# Patient Record
Sex: Female | Born: 1945 | Race: White | Hispanic: No | Smoking: Never smoker
Health system: Southern US, Community
[De-identification: ages and names within clinical notes are randomized; demographics above are authoritative.]

## PROBLEM LIST (undated history)

## (undated) DIAGNOSIS — G35 Multiple sclerosis: Secondary | ICD-10-CM

## (undated) DIAGNOSIS — B029 Zoster without complications: Secondary | ICD-10-CM

## (undated) HISTORY — PX: ROTATOR CUFF REPAIR: SHX139

## (undated) HISTORY — PX: OTHER SURGICAL HISTORY: SHX169

## (undated) HISTORY — DX: Zoster without complications: B02.9

## (undated) HISTORY — PX: KNEE SURGERY: SHX244

## (undated) HISTORY — PX: BACK SURGERY: SHX140

## (undated) HISTORY — DX: Multiple sclerosis: G35

---

## 2004-06-05 HISTORY — PX: REPLACEMENT TOTAL KNEE: SUR1224

## 2012-06-05 HISTORY — PX: OTHER SURGICAL HISTORY: SHX169

## 2015-07-07 DIAGNOSIS — G35 Multiple sclerosis: Secondary | ICD-10-CM | POA: Insufficient documentation

## 2017-08-03 DIAGNOSIS — F329 Major depressive disorder, single episode, unspecified: Secondary | ICD-10-CM | POA: Insufficient documentation

## 2017-08-03 DIAGNOSIS — I10 Essential (primary) hypertension: Secondary | ICD-10-CM | POA: Insufficient documentation

## 2017-08-03 DIAGNOSIS — N393 Stress incontinence (female) (male): Secondary | ICD-10-CM | POA: Insufficient documentation

## 2017-08-03 DIAGNOSIS — E559 Vitamin D deficiency, unspecified: Secondary | ICD-10-CM | POA: Insufficient documentation

## 2018-04-09 DIAGNOSIS — H538 Other visual disturbances: Secondary | ICD-10-CM | POA: Insufficient documentation

## 2018-07-17 DIAGNOSIS — M542 Cervicalgia: Secondary | ICD-10-CM | POA: Insufficient documentation

## 2019-01-08 ENCOUNTER — Ambulatory Visit: Payer: Medicare Other | Admitting: Neurology

## 2019-01-15 ENCOUNTER — Encounter: Payer: Self-pay | Admitting: Neurology

## 2019-01-15 ENCOUNTER — Telehealth: Payer: Self-pay | Admitting: *Deleted

## 2019-01-15 ENCOUNTER — Ambulatory Visit (INDEPENDENT_AMBULATORY_CARE_PROVIDER_SITE_OTHER): Payer: Medicare Other | Admitting: Neurology

## 2019-01-15 ENCOUNTER — Other Ambulatory Visit: Payer: Self-pay

## 2019-01-15 VITALS — BP 120/80 | HR 61 | Temp 95.7°F | Ht 66.0 in | Wt 196.4 lb

## 2019-01-15 DIAGNOSIS — F329 Major depressive disorder, single episode, unspecified: Secondary | ICD-10-CM

## 2019-01-15 DIAGNOSIS — E559 Vitamin D deficiency, unspecified: Secondary | ICD-10-CM

## 2019-01-15 DIAGNOSIS — G35 Multiple sclerosis: Secondary | ICD-10-CM | POA: Diagnosis not present

## 2019-01-15 DIAGNOSIS — Z79899 Other long term (current) drug therapy: Secondary | ICD-10-CM | POA: Diagnosis not present

## 2019-01-15 DIAGNOSIS — R269 Unspecified abnormalities of gait and mobility: Secondary | ICD-10-CM

## 2019-01-15 DIAGNOSIS — M1710 Unilateral primary osteoarthritis, unspecified knee: Secondary | ICD-10-CM | POA: Insufficient documentation

## 2019-01-15 DIAGNOSIS — M179 Osteoarthritis of knee, unspecified: Secondary | ICD-10-CM | POA: Insufficient documentation

## 2019-01-15 DIAGNOSIS — E785 Hyperlipidemia, unspecified: Secondary | ICD-10-CM

## 2019-01-15 DIAGNOSIS — N393 Stress incontinence (female) (male): Secondary | ICD-10-CM

## 2019-01-15 MED ORDER — MODAFINIL 100 MG PO TABS: ORAL_TABLET | ORAL | 1 refills | Status: DC

## 2019-01-15 MED ORDER — LAMOTRIGINE 100 MG PO TABS: 100.0000 mg | ORAL_TABLET | Freq: Two times a day (BID) | ORAL | 3 refills | Status: DC

## 2019-01-15 MED ORDER — ATORVASTATIN CALCIUM 20 MG PO TABS: 20.0000 mg | ORAL_TABLET | Freq: Every day | ORAL | 3 refills | Status: DC

## 2019-01-15 NOTE — Telephone Encounter (Signed)
Faxed completed/signed to [facility] at [phone]. They were requesting updated form. Received fax confirmation. Pt already on Aubagio.

## 2019-01-15 NOTE — Patient Instructions (Signed)
The pharmacy has the prescription for lamotrigine 100 mg tablets. For 5 days, just take one half pill a day. For the next 5 days, take one half pill twice a day. For the next 5 days, take one half pill 3 times a day Then start taking one pill twice a day from this point on.    In the future, we may increase the dose further.  If you get a rash, need to stop the medication and not take it again. 

## 2019-01-15 NOTE — Telephone Encounter (Signed)
I r/c report from [facility], pt cd will be mail to [place].

## 2019-01-15 NOTE — Progress Notes (Signed)
[facility]  PATIENT: Kathy Nelson DOB: 06/11/1945  REFERRING DOCTOR OR PCP: PCP is [facility] [place], referred by Dr. Coral Else FNP SOURCE: Patient, notes from [facility] neurology, imaging reports, MRI images personally reviewed.  _________________________________   HISTORICAL  CHIEF COMPLAINT:  Chief Complaint  Patient presents with  . New Patient (Initial Visit)    RM 13, alone. Paper referral from Dr. Geanie Cooley for MS. Used to see Dr. Anselm Pancoast at [facility]. Just moved to [place] area 10/30/18. Previous neurologist recommended Dr. Epimenio Foot.   . Multiple Sclerosis    She is on Aubagio, montly IVSM. Also on gabapentin.     HISTORY OF PRESENT ILLNESS:  I had the pleasure of seeing your patient, Kathy Nelson, at the MS center at [facility] for neurologic consultation regarding her multiple sclerosis.  She was diagnosed with MS in 49 (Dr. Dortha Schwalbe in [place]) after presenting with flank dysesthesias. In retrospect she had optic neuritis in 1987.    She was first told she might have cholecystitis.   Then Imaging studies were consistent with MS.   CSF was also consistent with MS.   She was placed on Avonex and remained on it x many years.    She did not have any exacerbations.  She moved to [facility] and started to see Dr. Freda Munro.  When he retired, she started to see Dr. Vernie Ammons.     More recently, she was placed on Aubagio (March 2020).    [facility] needs a new start form.   She has been doing monthly IV Solu-Medrol for the past year.     Gait has slowly worsened   Currently, she is off balanced and uses a cane as needed but walks without one much of the time.   She denies much weakness but notes more issues with her left leg due to left knee pain (she already had right TKR).   She has right flank tight dysesthetic sensations.  She also has  A swollen sensation in her left hand.     She takes gabapentin and  is unsure how much it helps.   She has some urinary leakage but no frank incontinence.   She has had some visual blurring but notes h/o cataracts and laser operations.    Colors are desaturated OD since she had ON in 1987 Marland KitchenGorden Nelson sees her).    She has fatigue and does better with modafinil.   She has some insomnia and can't turn her brain off some nights.    She has depression,a little worse during the pandemic as her assisted living is 'locked down' and there are no social activities.   Cognition is doing well.   Other medical issues:   She is noting more aches and pains in her neck, lower back and left knee.  She had lumbar surgery L4L5 in the past.   She has hypertension and atorvastatin.     REVIEW OF SYSTEMS: Constitutional: No fevers, chills, sweats, or change in appetite.  She has fatigue.   Eyes: No visual changes, double vision, eye pain Ear, nose and throat: No hearing loss, ear pain, nasal congestion, sore throat Cardiovascular: No chest pain, palpitations Respiratory: No shortness of breath at rest or with exertion.   No wheezes GastrointestinaI: No nausea, vomiting, diarrhea, abdominal pain, fecal incontinence Genitourinary: No dysuria, urinary retention or frequency.  No nocturia. Musculoskeletal:She has neck pain, low back pain and left knee pain.  She  has a history of lumbar surgery at L4-L5.   Integumentary: No rash, pruritus, skin lesions Neurological: as above Psychiatric: She has had depression. Endocrine: No palpitations, diaphoresis, change in appetite, change in weigh or increased thirst Hematologic/Lymphatic: No anemia, purpura, petechiae. Allergic/Immunologic: No itchy/runny eyes, nasal congestion, recent allergic reactions, rashes  ALLERGIES: Allergies  Allergen Reactions  . Penicillins Rash    Was told by her mother she was allergic to Penicillin Was told by her mother she was allergic to Penicillin     HOME MEDICATIONS:  Current Outpatient  Medications:  .  atorvastatin (LIPITOR) 20 MG tablet, Take 1 tablet (20 mg total) by mouth daily., Disp: 90 tablet, Rfl: 3 .  BIOTIN PO, Take 1 tablet by mouth daily., Disp: , Rfl:  .  buPROPion (WELLBUTRIN XL) 300 MG 24 hr tablet, Take 300 mg by mouth daily., Disp: , Rfl:  .  Cholecalciferol (VITAMIN D3 PO), Take 5,000 Units by mouth daily., Disp: , Rfl:  .  FLUoxetine (PROZAC) 20 MG tablet, Take 20 mg by mouth daily., Disp: , Rfl:  .  gabapentin (NEURONTIN) 600 MG tablet, Take 600 mg by mouth 3 (three) times daily., Disp: , Rfl:  .  ibuprofen (ADVIL) 200 MG tablet, Take 200 mg by mouth every 6 (six) hours as needed., Disp: , Rfl:  .  modafinil (PROVIGIL) 100 MG tablet, 1.5 tablets by mouth twice daily, Disp: 270 tablet, Rfl: 1 .  Omega-3 Fatty Acids (FISH OIL) 1000 MG CAPS, Take 1 capsule by mouth daily., Disp: , Rfl:  .  Teriflunomide (AUBAGIO) 14 MG TABS, Take 1 tablet by mouth daily., Disp: , Rfl:  .  valsartan (DIOVAN) 40 MG tablet, Take 40 mg by mouth daily., Disp: , Rfl:  .  lamoTRIgine (LAMICTAL) 100 MG tablet, Take 1 tablet (100 mg total) by mouth 2 (two) times daily., Disp: 180 tablet, Rfl: 3  PAST MEDICAL HISTORY: Past Medical History:  Diagnosis Date  . MS (multiple sclerosis) (HCC)   . Shingles     PAST SURGICAL HISTORY: Past Surgical History:  Procedure Laterality Date  . BACK SURGERY    . BACK SURGERY    . cataract surgery    . dental implants  2014  . KNEE SURGERY    . REPLACEMENT TOTAL KNEE  2006  . ROTATOR CUFF REPAIR      FAMILY HISTORY: History reviewed. No pertinent family history.  SOCIAL HISTORY:  Social History   Socioeconomic History  . Marital status: Widowed    Spouse name: Not on file  . Number of children: Not on file  . Years of education: Not on file  . Highest education level: Not on file  Occupational History  . Not on file  Social Needs  . Financial resource strain: Not on file  . Food insecurity    Worry: Not on file     Inability: Not on file  . Transportation needs    Medical: Not on file    Non-medical: Not on file  Tobacco Use  . Smoking status: Never Smoker  . Smokeless tobacco: Never Used  Substance and Sexual Activity  . Alcohol use: Yes    Frequency: Never    Comment: Daily with dinner - wine  . Drug use: Never  . Sexual activity: Not on file  Lifestyle  . Physical activity    Days per week: Not on file    Minutes per session: Not on file  . Stress: Not on file  Relationships  .  Social Musicianconnections    Talks on phone: Not on file    Gets together: Not on file    Attends religious service: Not on file    Active member of club or organization: Not on file    Attends meetings of clubs or organizations: Not on file    Relationship status: Not on file  . Intimate partner violence    Fear of current or ex partner: Not on file    Emotionally abused: Not on file    Physically abused: Not on file    Forced sexual activity: Not on file  Other Topics Concern  . Not on file  Social History Narrative   Lives alone   No caffeine use   Right handed      PHYSICAL EXAM  Vitals:   01/15/19 0902  BP: 120/80  Pulse: 61  Temp: (!) 95.7 F (35.4 C)  SpO2: 96%  Weight: 196 lb 6.4 oz (89.1 kg)  Height: 5\' 6"  (1.676 m)    Body mass index is 31.7 kg/m.   General: The patient is well-developed and well-nourished and in no acute distress  HEENT:  Head is [place]/AT.  Sclera are anicteric.  Funduscopic exam shows normal optic discs and retinal vessels.  Neck: No carotid bruits are noted.  The neck is nontender.  Cardiovascular: The heart has a regular rate and rhythm with a normal S1 and S2. There were no murmurs, gallops or rubs.    Skin: Extremities are without rash or  edema.  Musculoskeletal:  Back is nontender  Neurologic Exam  Mental status: The patient is alert and oriented x 3 at the time of the examination. The patient has apparent normal recent and remote memory, with an  apparently normal attention span and concentration ability.   Speech is normal.  Cranial nerves: Extraocular movements are full. Pupils are equal, round, and reactive to light and accomodation.  Colors desaturated OD.  Facial symmetry is present. There is good facial sensation to soft touch bilaterally.Facial strength is normal.  Trapezius and sternocleidomastoid strength is normal. No dysarthria is noted.  The tongue is midline, and the patient has symmetric elevation of the soft palate. No obvious hearing deficits are noted.  Motor:  Muscle bulk is normal.   Tone is normal. Strength is  5 / 5 in all 4 extremities.   Sensory: Sensory testing is intact to pinprick, soft touch and vibration sensation in arms and trunk but reduced in right leg.     Coordination: Cerebellar testing reveals good finger-nose-finger and heel-to-shin bilaterally.  Gait and station: Station is normal.   Gait is slightly wide. She can't tandem walk. Romberg is negative.   Reflexes: Deep tendon reflexes are symmetric and normal bilaterally.   Plantar responses are flexor.      ASSESSMENT AND PLAN     1. Multiple sclerosis (HCC)   2. Gait disturbance   3. High risk medication use   4. Major depressive disorder, remission status unspecified, unspecified whether recurrent   5. Vitamin D deficiency   6. Stress incontinence, female   7. Hyperlipidemia, unspecified hyperlipidemia type     In summary, Ms. Dorian FurnaceBullen is a 73 year old woman with a long history of multiple sclerosis who is transferring her care due to her recent move.  Her current impairments are stable and she will continue on Aubagio.  We will check blood work today.  Due to her persistent dysesthesias, I will start lamotrigine and titrate up to 100 mg p.o. twice  daily.  I will renew her Provigil and statin.  She would like to defer repeating the MRI at this time.  She has been stable we will consider checking around the time of the next visit to determine  if there is any subclinical activity that would prompt a switch in medication.  She is advised to stay active and exercise as tolerated and to continue vitamin D supplementation.  She will return to see me in 6 months for regular visit but call sooner if new or worsening neurologic symptoms.  Thank you for asking me to see Ms. Ess.  Please let me know if I can be of further assistance with her or other patients in the future.    Alie Hardgrove A. Felecia Shelling, MD, PhD, FAAN Certified in Neurology, Clinical Neurophysiology, Sleep Medicine, Pain Medicine and Neuroimaging Director, [place] at [facility] [address] [place], [place] [address] [phone]

## 2019-01-15 NOTE — Telephone Encounter (Signed)
Gave report to Dr. Felecia Shelling to review

## 2019-01-15 NOTE — Telephone Encounter (Signed)
Request made today to [facility] [phone]

## 2019-01-16 ENCOUNTER — Telehealth: Payer: Self-pay | Admitting: *Deleted

## 2019-01-16 LAB — CBC WITH DIFFERENTIAL/PLATELET
Basophils Absolute: 0.1 10*3/uL (ref 0.0–0.2)
Basos: 1 %
EOS (ABSOLUTE): 0.3 10*3/uL (ref 0.0–0.4)
Eos: 4 %
Hematocrit: 40.3 % (ref 34.0–46.6)
Hemoglobin: 13.2 g/dL (ref 11.1–15.9)
Immature Grans (Abs): 0 10*3/uL (ref 0.0–0.1)
Immature Granulocytes: 0 %
Lymphocytes Absolute: 2 10*3/uL (ref 0.7–3.1)
Lymphs: 26 %
MCH: 28.6 pg (ref 26.6–33.0)
MCHC: 32.8 g/dL (ref 31.5–35.7)
MCV: 87 fL (ref 79–97)
Monocytes Absolute: 0.8 10*3/uL (ref 0.1–0.9)
Monocytes: 11 %
Neutrophils Absolute: 4.4 10*3/uL (ref 1.4–7.0)
Neutrophils: 58 %
Platelets: 227 10*3/uL (ref 150–450)
RBC: 4.61 x10E6/uL (ref 3.77–5.28)
RDW: 13.1 % (ref 11.7–15.4)
WBC: 7.8 10*3/uL (ref 3.4–10.8)

## 2019-01-16 LAB — COMPREHENSIVE METABOLIC PANEL
ALT: 19 IU/L (ref 0–32)
AST: 19 IU/L (ref 0–40)
Albumin/Globulin Ratio: 2.2 (ref 1.2–2.2)
Albumin: 4.3 g/dL (ref 3.7–4.7)
Alkaline Phosphatase: 98 IU/L (ref 39–117)
BUN/Creatinine Ratio: 20 (ref 12–28)
BUN: 14 mg/dL (ref 8–27)
Bilirubin Total: 0.4 mg/dL (ref 0.0–1.2)
CO2: 28 mmol/L (ref 20–29)
Calcium: 9.6 mg/dL (ref 8.7–10.3)
Chloride: 102 mmol/L (ref 96–106)
Creatinine, Ser: 0.69 mg/dL (ref 0.57–1.00)
GFR calc Af Amer: 98 mL/min/{1.73_m2} (ref >59)
GFR calc non Af Amer: 85 mL/min/{1.73_m2} (ref >59)
Globulin, Total: 2 g/dL (ref 1.5–4.5)
Glucose: 80 mg/dL (ref 65–99)
Potassium: 4.4 mmol/L (ref 3.5–5.2)
Sodium: 141 mmol/L (ref 134–144)
Total Protein: 6.3 g/dL (ref 6.0–8.5)

## 2019-01-16 NOTE — Telephone Encounter (Signed)
Called, LVM for pt about lab results per Dr. Felecia Shelling note. Gave [facility] phone number if she has further questions but instructed she did not have to call back otherwise.

## 2019-01-16 NOTE — Telephone Encounter (Signed)
-----   Message from Richard A Sater, MD sent at 01/16/2019  9:16 AM EDT ----- Please let the patient know that the lab work is fine.  

## 2019-01-23 NOTE — Telephone Encounter (Signed)
R/c cd from [facility] cd on [place] desk

## 2019-01-23 NOTE — Telephone Encounter (Signed)
Gave CD to Dr. Felecia Shelling to review

## 2019-01-27 ENCOUNTER — Telehealth: Payer: Self-pay | Admitting: Neurology

## 2019-01-27 NOTE — Telephone Encounter (Signed)
I personally reviewed MRIs of the brain, cervical and thoracic spine dated 11/23/2017.   The MRI of the brain shows typical T2/flair hyperintense foci in the periventricular, juxtacortical deep white matter of the hemispheres consistent with MS.  There is cortical atrophy.   MRI of the cervical spine shows no definite MS plaques.  Cervical degenerative changes noted at C3-C4, C4-C5, C5-C6 and C6-C7.  There is bilateral foraminal narrowing at C5-C6 and left greater than right foraminal narrowing at C6-C7  MRI of the thoracic spine shows several T2 hyperintense foci.   Unfortunately, the CD PACS does not have the ability to assess spinal levels with scout lines.  In this context, there appears to be a focus at T3-T4, T5-T6 and T8-T9 towards the left.

## 2019-02-03 ENCOUNTER — Telehealth: Payer: Self-pay | Admitting: Neurology

## 2019-02-03 NOTE — Telephone Encounter (Signed)
Pt is asking for a call to discuss the instructions on taking the lamoTRIgine (LAMICTAL) 100 MG tablet

## 2019-02-03 NOTE — Telephone Encounter (Signed)
Called pt back. She wanted to know if she should be stopping the gabapentin and starting lamotrigine. She has directions from Dr. Felecia Shelling for lamotrigine and aware pill can be cut in half: For 5 days, just take one half pill a day. For the next 5 days, take one half pill twice a day. For the next 5 days, take one half pill 3 times a day Then start taking one pill twice a day from this point on.   In the future, we may increase the dose further.  Advised I will ask Dr. Felecia Shelling and call back to let her know. She states its ok to LVM

## 2019-02-03 NOTE — Telephone Encounter (Signed)
She can taper the gabapentin off over a week or so as she is starting on the lamotrigine.

## 2019-02-03 NOTE — Telephone Encounter (Signed)
Dr. Felecia Shelling- how would you like her to taper off the gabapentin?

## 2019-02-03 NOTE — Telephone Encounter (Signed)
Go down to two a day for a week then go down to 1 a day for a week then stop

## 2019-02-03 NOTE — Telephone Encounter (Signed)
Dr. Felecia Shelling- should she stop gabapentin and start lamotrigine or was lamotrigine an addition to her medications?

## 2019-02-04 NOTE — Telephone Encounter (Signed)
Called pt. She has about 24 capsule of gabapentin left. She repeated back taper instructions correctly: 2/day for 1 week and then go down to 1 per day for 1 week and stop. She will call back if she has further questions. She is aware she can taper off gabapentin as she is starting on the lamotrigine per Dr. Felecia Shelling.

## 2019-04-09 ENCOUNTER — Other Ambulatory Visit: Payer: Self-pay | Admitting: *Deleted

## 2019-04-09 ENCOUNTER — Telehealth: Payer: Self-pay | Admitting: *Deleted

## 2019-04-09 DIAGNOSIS — G35 Multiple sclerosis: Secondary | ICD-10-CM

## 2019-04-09 MED ORDER — AUBAGIO 14 MG PO TABS: 1.0000 | ORAL_TABLET | Freq: Every day | ORAL | 12 refills | Status: DC

## 2019-04-09 NOTE — Telephone Encounter (Signed)
Received fax from [place] one to one to send new rx for Aubagio #30, 12 refills for PAP. Faxed printed/signed rx to them at [phone]. Received fax confirmation.

## 2019-05-08 ENCOUNTER — Telehealth: Payer: Self-pay | Admitting: Neurology

## 2019-05-08 NOTE — Telephone Encounter (Signed)
Pt has called for refills on FLUoxetine (PROZAC) 20 MG tablet,  buPROPion (WELLBUTRIN XL) 300 MG 24 hr tablet,atorvastatin (LIPITOR) 20 MG tablet, & amdlopipine 5mg  Pt is asking that these be filled thru  [facility] ph# [phone]

## 2019-05-08 NOTE — Telephone Encounter (Signed)
Called pt back. Relayed that I reviewed her chart and appears Dr. Felecia Shelling refilled atorvastatin 01/15/2019 #90, 3 refills to [facility]. The other three meds: fluoxetine, bupropion, amlodipine he did not prescribe. Recommended she contact PCP about refilling these. She verbalized understanding.

## 2019-05-13 ENCOUNTER — Telehealth: Payer: Self-pay | Admitting: *Deleted

## 2019-05-13 DIAGNOSIS — G35 Multiple sclerosis: Secondary | ICD-10-CM

## 2019-05-13 NOTE — Telephone Encounter (Signed)
Called and spoke with pt. Advised we received fax from [place] one to one that [place] will expire soon. They list insurance as [employer] but we have [place] on file. She states she has both. [employer] is for prescription coverage. ID: [id number]. Rxgroup: [id number]. RxPCN: [id number]. RxBIN: [id number]. Phone: [phone].   She was told there is no funding left after the first of the year by [facility] but told to call back early January to speak with them and work out next steps.  FYI-She is going to see Dr. Ouida Sills (PCP) to establish care at [facility]. She is living at [facility] right now.

## 2019-05-14 NOTE — Telephone Encounter (Signed)
Unable to submit PA request through covermymeds prior to expiration date of current one on file.  I called [employer]'s PA line [phone]) to initiate the case over the phone.  The rep was able to pull her previous formulary exception information forward to the new case (BC#[id number]).  The decision should be received in 24-72 hours.  If no information by then, call [phone].

## 2019-05-19 NOTE — Telephone Encounter (Signed)
Received fax notification from [facility] that [place] approved until 06/04/2020. Faxed approval info to [place] one to one at [phone]. Received fax confirmation.

## 2019-05-23 NOTE — Telephone Encounter (Signed)
Pt has called to inform that she has dropped out of the Aubagio program due to annual cost being $24,000 pt said even with insurance she would be looking at $2,000 a month which is not an option for her.  Pt is refusing to spend a lot of time on the phone with the [employer].  Pt states Dr Felecia Shelling will need to work out something for her if he wants her to stay on this medication.  If not pt is asking to be put on something else

## 2019-05-26 NOTE — Telephone Encounter (Signed)
Called, LVM for pt to call office. 

## 2019-05-26 NOTE — Telephone Encounter (Signed)
Took call from phone staff and spoke with pt. She was not accepted into financial assistance program again for Aubagio. They only could provide her for the 1 yr she had it. She called other foundations but no one else had funding she called. She then received letter from [facility] that she will have 2000 copay each month for Aubagio even though it is covered. Usually 24,000 dollars per year. They gave her another list of foundations to call to receive funding. She declined. She does not want to spend more time to look for funding. She would like to talk about other options. She is aware Dr. Felecia Shelling back in the office next week. She has about 2 months of Aubagio left. She is ok to wait until Dr. Felecia Shelling comes back to determine next steps. Advised I will call her next week.

## 2019-05-28 MED ORDER — LEFLUNOMIDE 20 MG PO TABS: ORAL_TABLET | ORAL | 11 refills | Status: DC

## 2019-05-28 NOTE — Telephone Encounter (Signed)
If she would like, we can start her on leflunomide 20 mg, 1 a day. You can let her know that leflunomide and Aubagio are very similar. Leflunomide has been used for rheumatoid arthritis for many years. The main difference is that Aubagio is usually tolerated a little bit better (GI) but that that difference matter is most during the first month or 2.  Therefore, she should be able to take Aubagio 1 day and stop leflunomide the next. If she would prefer to have an appointment with me we can try to do something in January.

## 2019-05-28 NOTE — Addendum Note (Signed)
Addended by: Hope Pigeon on: 05/28/2019 12:43 PM   Modules accepted: Orders

## 2019-05-28 NOTE — Telephone Encounter (Signed)
Called and spoke with pt. She is going to finish what she has of Aubagio and then switch to leflunomide. She would like rx sent to [place], [place].

## 2019-06-07 ENCOUNTER — Telehealth: Payer: Self-pay | Admitting: Neurology

## 2019-06-07 NOTE — Telephone Encounter (Signed)
I reviewed her neuro-opth evaluation 04/02/19.  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  No acute findings

## 2019-06-24 ENCOUNTER — Other Ambulatory Visit: Payer: Self-pay

## 2019-06-24 ENCOUNTER — Emergency Department: Payer: Medicare Other

## 2019-06-24 ENCOUNTER — Emergency Department
Admission: EM | Admit: 2019-06-24 | Discharge: 2019-06-24 | Disposition: A | Discharge status: Home / Self Care | Payer: Medicare Other | Attending: Emergency Medicine | Admitting: Emergency Medicine

## 2019-06-24 ENCOUNTER — Encounter: Payer: Self-pay | Admitting: Emergency Medicine

## 2019-06-24 DIAGNOSIS — Y999 Unspecified external cause status: Secondary | ICD-10-CM | POA: Insufficient documentation

## 2019-06-24 DIAGNOSIS — Y92003 Bedroom of unspecified non-institutional (private) residence as the place of occurrence of the external cause: Secondary | ICD-10-CM | POA: Insufficient documentation

## 2019-06-24 DIAGNOSIS — W01190A Fall on same level from slipping, tripping and stumbling with subsequent striking against furniture, initial encounter: Secondary | ICD-10-CM | POA: Diagnosis not present

## 2019-06-24 DIAGNOSIS — Z79899 Other long term (current) drug therapy: Secondary | ICD-10-CM | POA: Insufficient documentation

## 2019-06-24 DIAGNOSIS — G35 Multiple sclerosis: Secondary | ICD-10-CM | POA: Diagnosis not present

## 2019-06-24 DIAGNOSIS — I10 Essential (primary) hypertension: Secondary | ICD-10-CM | POA: Insufficient documentation

## 2019-06-24 DIAGNOSIS — S79911A Unspecified injury of right hip, initial encounter: Secondary | ICD-10-CM | POA: Diagnosis present

## 2019-06-24 DIAGNOSIS — S7001XA Contusion of right hip, initial encounter: Secondary | ICD-10-CM | POA: Diagnosis not present

## 2019-06-24 DIAGNOSIS — Y9389 Activity, other specified: Secondary | ICD-10-CM | POA: Insufficient documentation

## 2019-06-24 NOTE — Discharge Instructions (Signed)
Please continue medications as you have listed.  Follow up with primary care or orthopedics if not improving over the week.  Return to the ER for symptoms that change or worsen or for new concerns if unable to schedule an appointment.

## 2019-06-24 NOTE — ED Notes (Signed)
See triage note  Presents s/p fall  States she fell   Hitting her right mid back and right buttocks

## 2019-06-24 NOTE — ED Triage Notes (Signed)
Patient reports she tripped in her bedroom and fell backward, hitting the middle of her back on the bed post. States she is now having pain in her back with twisting and bending. Ambulatory with cane in triage.

## 2019-06-24 NOTE — ED Provider Notes (Signed)
[facility] Emergency Department Provider Note ____________________________________________  Time seen: Approximately 5:19 PM  I have reviewed the triage vital signs and the nursing notes.   HISTORY  Chief Complaint Back Pain    HPI Kathy Nelson is a 74 y.o. female who presents to the emergency department for evaluation and treatment of right hip pain aftertripping in her bedroom. Her right hip struck the squared edge of the footboard of her bed. She states that the pain was intense and she laid on the floor for a bit until she felt she could bear weight. She was then able to get up without assistance. She took Excedrin and applied Arnica to the area and then decided that she may need to have an x-ray due to the degree of pain. No previous hip injuries/surgery.  Past Medical History:  Diagnosis Date  . MS (multiple sclerosis) ([place])   . Shingles     Patient Active Problem List   Diagnosis Date Noted  . Osteoarthritis of knee 01/15/2019  . Gait disturbance 01/15/2019  . High risk medication use 01/15/2019  . Hyperlipidemia 01/15/2019  . Neck pain 07/17/2018  . Blurry vision, bilateral 04/09/2018  . Essential hypertension 08/03/2017  . Major depressive disorder 08/03/2017  . Stress incontinence, female 08/03/2017  . Vitamin D deficiency 08/03/2017  . Multiple sclerosis ([place]) 07/07/2015    Past Surgical History:  Procedure Laterality Date  . BACK SURGERY    . BACK SURGERY    . cataract surgery    . dental implants  2014  . KNEE SURGERY    . REPLACEMENT TOTAL KNEE  2006  . ROTATOR CUFF REPAIR      Prior to Admission medications   Medication Sig Start Date End Date Taking? Authorizing Provider  atorvastatin (LIPITOR) 20 MG tablet Take 1 tablet (20 mg total) by mouth daily. 01/15/19   Sater, Nanine Means, MD  BIOTIN PO Take 1 tablet by mouth daily.    [provider]  buPROPion (WELLBUTRIN XL) 300 MG 24 hr tablet Take 300 mg by mouth  daily.    [provider]  Cholecalciferol (VITAMIN D3 PO) Take 5,000 Units by mouth daily.    [provider]  FLUoxetine (PROZAC) 20 MG tablet Take 20 mg by mouth daily.    [provider]  gabapentin (NEURONTIN) 600 MG tablet Take 600 mg by mouth 3 (three) times daily.    [provider]  ibuprofen (ADVIL) 200 MG tablet Take 200 mg by mouth every 6 (six) hours as needed.    [provider]  lamoTRIgine (LAMICTAL) 100 MG tablet Take 1 tablet (100 mg total) by mouth 2 (two) times daily. 01/15/19   Sater, Nanine Means, MD  modafinil (PROVIGIL) 100 MG tablet 1.5 tablets by mouth twice daily 01/15/19   Sater, Nanine Means, MD  Omega-3 Fatty Acids (FISH OIL) 1000 MG CAPS Take 1 capsule by mouth daily.    [provider]  Teriflunomide (AUBAGIO) 14 MG TABS Take 1 tablet by mouth daily. 04/09/19   Sater, Nanine Means, MD  valsartan (DIOVAN) 40 MG tablet Take 40 mg by mouth daily.    [provider]    Allergies Penicillins  No family history on file.  Social History Social History   Tobacco Use  . Smoking status: Never Smoker  . Smokeless tobacco: Never Used  Substance Use Topics  . Alcohol use: Yes    Comment: Daily with dinner - wine  . Drug use: Never  Review of Systems Constitutional: Negative for fever. Cardiovascular: Negative for chest pain. Respiratory: Negative for shortness of breath. Musculoskeletal: Positive for right hip pain. Skin: Negative for open wounds or lesions.  Neurological: Negative for decrease in sensation  ____________________________________________   PHYSICAL EXAM:  VITAL SIGNS: ED Triage Vitals [06/24/19 1657]  Enc Vitals Group     BP (!) 198/76     Pulse Rate 73     Resp 16     Temp 98.2 F (36.8 C)     Temp Source Oral     SpO2 97 %     Weight 180 lb (81.6 kg)     Height 5\' 6"  (1.676 m)     Head Circumference      Peak Flow      Pain Score 9     Pain Loc      Pain Edu?      Excl.  in GC?     Constitutional: Alert and oriented. Well appearing and in no acute distress. Eyes: Conjunctivae are clear without discharge or drainage Head: Atraumatic Neck: Supple.  Respiratory: No cough. Respirations are even and unlabored. Musculoskeletal: Focal area of tenderness in the lower,posterior, midline right buttock that increases with movement. Neurologic: Motor and sensory intact.  Skin: Intact.  Psychiatric: Affect and behavior are appropriate.  ____________________________________________   LABS (all labs ordered are listed, but only abnormal results are displayed)  Labs Reviewed - No data to display ____________________________________________  RADIOLOGY  Image of the right hip and pelvis are negative for acute findings per radiology. ____________________________________________   PROCEDURES  Procedures  ____________________________________________   INITIAL IMPRESSION / ASSESSMENT AND PLAN / ED COURSE  Kathy Nelson is a 74 y.o. who presents to the emergency department for evaluation of right hip pain after accidental injury at home this afternoon. See HPI for further details. Plan will be to get an x-ray.  Differential diagnosis includes, but not limited to: Pelvic fracture, bone contusion, hematoma.  X-ray is reassuring and shows no fractures. She will be discharged home. Pain medication offered, however she prefers to take her Excedrin or ibuprofen and use ice.  Patient instructed to follow-up with orthopedics if not improving over the week.  She was also instructed to return to the emergency department for symptoms that change or worsen if unable schedule an appointment with orthopedics or primary care.  Medications - No data to display  Pertinent labs & imaging results that were available during my care of the patient were reviewed by me and considered in my medical decision making (see chart for  details).  _________________________________________   FINAL CLINICAL IMPRESSION(S) / ED DIAGNOSES  Final diagnoses:  Contusion of right hip, initial encounter    ED Discharge Orders    None       If controlled substance prescribed during this visit, 12 month history viewed on the [facility] prior to issuing an initial prescription for Schedule II or III opiod.   59, FNP 06/24/19 1931    06/26/19, MD 06/25/19 2348

## 2019-07-22 ENCOUNTER — Ambulatory Visit: Payer: Medicare Other | Admitting: Neurology

## 2019-07-23 ENCOUNTER — Telehealth: Payer: Self-pay | Admitting: Neurology

## 2019-07-23 NOTE — Telephone Encounter (Signed)
I called patient and LVM regarding rescheduling 2/18 appointment due to inclement weather/office closure. Requested patient call back to reschedule.

## 2019-07-24 ENCOUNTER — Ambulatory Visit: Payer: Medicare Other | Admitting: Neurology

## 2019-08-07 ENCOUNTER — Other Ambulatory Visit: Payer: Self-pay | Admitting: *Deleted

## 2019-08-07 MED ORDER — MODAFINIL 100 MG PO TABS: ORAL_TABLET | ORAL | 1 refills | Status: DC

## 2019-08-12 ENCOUNTER — Telehealth: Payer: Self-pay | Admitting: Neurology

## 2019-08-12 NOTE — Telephone Encounter (Signed)
PA Case: [id number], Status: Approved, Coverage Starts on: 08/12/2019 12:00:00 AM, Coverage Ends on: 06/04/2020 12:00:00 AM. Questions? Contact [phone].

## 2019-08-12 NOTE — Telephone Encounter (Signed)
1) Medication(s) Requested (by name): modafinil (PROVIGIL) 100 MG tablet   2) Pharmacy of Choice: [facility] - [place], [place] - [address]  [address] [place] [address]   3) Special Requests:   Pharmacy is requesting a PA

## 2019-08-12 NOTE — Telephone Encounter (Signed)
Submitted PA on CMM. Key: [id number] - PA Case ID: [id number] - Rx #: [id number]. Waiting on determination from [facility].

## 2019-09-18 ENCOUNTER — Other Ambulatory Visit: Payer: Self-pay

## 2019-09-18 ENCOUNTER — Ambulatory Visit (INDEPENDENT_AMBULATORY_CARE_PROVIDER_SITE_OTHER): Payer: Medicare Other | Admitting: Neurology

## 2019-09-18 ENCOUNTER — Encounter: Payer: Self-pay | Admitting: Neurology

## 2019-09-18 VITALS — BP 163/78 | HR 65 | Temp 96.9°F | Ht 66.0 in | Wt 180.0 lb

## 2019-09-18 DIAGNOSIS — Z79899 Other long term (current) drug therapy: Secondary | ICD-10-CM

## 2019-09-18 DIAGNOSIS — R269 Unspecified abnormalities of gait and mobility: Secondary | ICD-10-CM | POA: Diagnosis not present

## 2019-09-18 DIAGNOSIS — R5383 Other fatigue: Secondary | ICD-10-CM

## 2019-09-18 DIAGNOSIS — G35 Multiple sclerosis: Secondary | ICD-10-CM | POA: Diagnosis not present

## 2019-09-18 MED ORDER — AMANTADINE HCL 100 MG PO CAPS: 100.0000 mg | ORAL_CAPSULE | Freq: Two times a day (BID) | ORAL | 11 refills | Status: DC

## 2019-09-18 MED ORDER — ALPRAZOLAM 0.5 MG PO TABS: ORAL_TABLET | ORAL | 0 refills | Status: DC

## 2019-09-18 NOTE — Progress Notes (Signed)
[facility]  PATIENT: Kathy Nelson DOB: 08-Feb-1946  REFERRING DOCTOR OR PCP: P[facility] is [facility] in [place], referred by Dr. Coral Else FNP SOURCE: Patient, notes from [facility] neurology, imaging reports, MRI images personally reviewed.  _________________________________   HISTORICAL  CHIEF COMPLAINT:  Chief Complaint  Patient presents with  . Follow-up    RM 12, alone. Last seen 01/15/2019  . Multiple Sclerosis    She never started leflunomide, forgot about conversation in [place] that we were switching her to this since Aubagio unaffordable, could not get financial assistance. She also did not taper off gabapentin, still taking this and lamotrigine.    HISTORY OF PRESENT ILLNESS:  Kathy Nelson is a 74 y.o. woman with multiple sclerosis.  Update 09/18/2019 She had been on Aubagio (from 07/2018 to 07/2019) but was unable to get copay support and her portion would have been 2000/month.    She has not taken any over the past year  She reports her gait is doing worse.    She gets out of breath easily when she walks.   She feels she gets more fatigue and notes being more sleepy.  She feels Gait is worse --- slowly worsened   Currently, she iuses a cane for balance as needed but walks without one inside the house   She denies much weakness She also has left hip > knee pain which bothers her more when walking.  .  She is on gabapentin for dysesthesia.  She was still having a lot of dysesthesias last year but is better now.  We had prescribed lamotrigine but she never started,.   She has some urinary leakage but no frank incontinence.   She has had some visual blurring but notes h/o cataracts and laser operations.    Colors are desaturated OD since she had ON in 1987.   She takes Provigil 150 mg bid for fatigue with mild benefit.    She had done PT for her gait when she got to [facility] but stopped as there was no benefit.      From  01/15/2020: She was diagnosed with MS in 1991 (Dr. Dortha Schwalbe in [place]) after presenting with flank dysesthesias. In retrospect she had optic neuritis in 1987.    She was first told she might have cholecystitis.   Then Imaging studies were consistent with MS.   CSF was also consistent with MS.   She was placed on Avonex and remained on it x many years.    She did not have any exacerbations.  She moved to [facility] and started to see Dr. Freda Munro.  When he retired, she started to see Dr. Vernie Ammons.     More recently, she was placed on Aubagio (March 2020).    MS One to One needs a new start form.   She has been doing monthly IV Solu-Medrol for the past year.     Gait has slowly worsened   Currently, she is off balanced and uses a cane as needed but walks without one much of the time.   She denies much weakness but notes more issues with her left leg due to left knee pain (she already had right TKR).   She has right flank tight dysesthetic sensations.  She also has  A swollen sensation in her left hand.     She takes gabapentin and is unsure how much it helps.   She has some urinary leakage but no frank incontinence.   She has had  some visual blurring but notes h/o cataracts and laser operations.    Colors are desaturated OD since she had ON in 1987 Marland KitchenRobyne Peers sees her).    She has fatigue and does better with modafinil.   She has some insomnia and can't turn her brain off some nights.    She has depression,a little worse during the pandemic as her assisted living is 'locked down' and there are no social activities.   Cognition is doing well.   Other medical issues:   She is noting more aches and pains in her neck, lower back and left knee.  She had lumbar surgery L4L5 in the past.   She has hypertension and atorvastatin.     REVIEW OF SYSTEMS: Constitutional: No fevers, chills, sweats, or change in appetite.  She has fatigue.   Eyes: No visual changes, double vision, eye pain Ear, nose and throat: No  hearing loss, ear pain, nasal congestion, sore throat Cardiovascular: No chest pain, palpitations Respiratory: No shortness of breath at rest or with exertion.   No wheezes GastrointestinaI: No nausea, vomiting, diarrhea, abdominal pain, fecal incontinence Genitourinary: No dysuria, urinary retention or frequency.  No nocturia. Musculoskeletal:She has neck pain, low back pain and left knee pain.  She has a history of lumbar surgery at L4-L5.   Integumentary: No rash, pruritus, skin lesions Neurological: as above Psychiatric: She has had depression. Endocrine: No palpitations, diaphoresis, change in appetite, change in weigh or increased thirst Hematologic/Lymphatic: No anemia, purpura, petechiae. Allergic/Immunologic: No itchy/runny eyes, nasal congestion, recent allergic reactions, rashes  ALLERGIES: Allergies  Allergen Reactions  . Penicillins Rash    Was told by her mother she was allergic to Penicillin Was told by her mother she was allergic to Penicillin     HOME MEDICATIONS:  Current Outpatient Medications:  .  amLODipine (NORVASC) 5 MG tablet, Take 5 mg by mouth daily., Disp: , Rfl:  .  atorvastatin (LIPITOR) 20 MG tablet, Take 1 tablet (20 mg total) by mouth daily., Disp: 90 tablet, Rfl: 3 .  BIOTIN PO, Take 1,000 mg by mouth daily. RNA Biotin, Disp: , Rfl:  .  buPROPion (WELLBUTRIN XL) 300 MG 24 hr tablet, Take 300 mg by mouth daily., Disp: , Rfl:  .  Cholecalciferol (VITAMIN D3 PO), Take 7,000 Units by mouth daily. Vit D3, Disp: , Rfl:  .  FLUoxetine (PROZAC) 20 MG tablet, Take 20 mg by mouth daily., Disp: , Rfl:  .  gabapentin (NEURONTIN) 600 MG tablet, Take 600 mg by mouth 3 (three) times daily., Disp: , Rfl:  .  ibuprofen (ADVIL) 200 MG tablet, Take 200 mg by mouth every 6 (six) hours as needed., Disp: , Rfl:  .  lamoTRIgine (LAMICTAL) 100 MG tablet, Take 1 tablet (100 mg total) by mouth 2 (two) times daily., Disp: 180 tablet, Rfl: 3 .  modafinil (PROVIGIL) 100 MG  tablet, 1.5 tablets by mouth twice daily, Disp: 270 tablet, Rfl: 1 .  Omega-3 Fatty Acids (FISH OIL) 1000 MG CAPS, Take 1 capsule by mouth daily., Disp: , Rfl:  .  valsartan (DIOVAN) 40 MG tablet, Take 40 mg by mouth daily., Disp: , Rfl:   PAST MEDICAL HISTORY: Past Medical History:  Diagnosis Date  . MS (multiple sclerosis) ([place])   . Shingles     PAST SURGICAL HISTORY: Past Surgical History:  Procedure Laterality Date  . BACK SURGERY    . BACK SURGERY    . cataract surgery    . dental implants  2014  .  KNEE SURGERY    . REPLACEMENT TOTAL KNEE  2006  . ROTATOR CUFF REPAIR      FAMILY HISTORY: No family history on file.  SOCIAL HISTORY:  Social History   Socioeconomic History  . Marital status: Widowed    Spouse name: Not on file  . Number of children: Not on file  . Years of education: Not on file  . Highest education level: Not on file  Occupational History  . Not on file  Tobacco Use  . Smoking status: Never Smoker  . Smokeless tobacco: Never Used  Substance and Sexual Activity  . Alcohol use: Yes    Comment: Daily with dinner - wine  . Drug use: Never  . Sexual activity: Not on file  Other Topics Concern  . Not on file  Social History Narrative   Lives alone   No caffeine use   Right handed    Social Determinants of Health   Financial Resource Strain:   . Difficulty of Paying Living Expenses:   Food Insecurity:   . Worried About Programme researcher, broadcasting/film/video in the Last Year:   . Barista in the Last Year:   Transportation Needs:   . Freight forwarder (Medical):   Marland Kitchen Lack of Transportation (Non-Medical):   Physical Activity:   . Days of Exercise per Week:   . Minutes of Exercise per Session:   Stress:   . Feeling of Stress :   Social Connections:   . Frequency of Communication with Friends and Family:   . Frequency of Social Gatherings with Friends and Family:   . Attends Religious Services:   . Active Member of Clubs or Organizations:   .  Attends Banker Meetings:   Marland Kitchen Marital Status:   Intimate Partner Violence:   . Fear of Current or Ex-Partner:   . Emotionally Abused:   Marland Kitchen Physically Abused:   . Sexually Abused:      PHYSICAL EXAM  Vitals:   09/18/19 1514  BP: (!) 163/78  Pulse: 65  Temp: (!) 96.9 F (36.1 C)  Weight: 180 lb (81.6 kg)  Height: 5\' 6"  (1.676 m)    Body mass index is 29.05 kg/m.   General: The patient is well-developed and well-nourished and in no acute distress  HEENT:  Head is [place]/AT.  Sclera are anicteric.   Skin: Extremities are without rash or  edema.  Neurologic Exam  Mental status: The patient is alert and oriented x 3 at the time of the examination. The patient has apparent normal recent and remote memory, with an apparently normal attention span and concentration ability.   Speech is normal.  Cranial nerves: Extraocular movements are full.  She has reduced color vision OD.  Facial symmetry is present. There is good facial sensation to soft touch bilaterally.Facial strength is normal.  Trapezius and sternocleidomastoid strength is normal. No dysarthria is noted.  The tongue is midline, and the patient has symmetric elevation of the soft palate. No obvious hearing deficits are noted.  Motor:  Muscle bulk is normal.   Tone is normal. Strength is  5 / 5 in all 4 extremities.   Sensory: Sensory testing is intact to pinprick, soft touch and vibration sensation in arms and trunk but reduced in right leg.     Coordination: Cerebellar testing reveals good finger-nose-finger and heel-to-shin bilaterally.  Gait and station: Station is normal.   Gait is slightly wide. She can't tandem walk. Romberg is negative.  Reflexes: Deep tendon reflexes are symmetric and normal bilaterally.       ASSESSMENT AND PLAN     No diagnosis found.  1.  We had a long discussion about continuation of therapy for her MS.  She is unable to get patient assistance for Aubagio and the expense  would be very high.  One option would be to start leflunomide.  Leflunomide is actually a precursor of teriflunomide but is much cheaper as it has been generic for many years.  I have successfully transition some patients from Aubagio to leflunomide in order to be affordable.  A second option would be to go off of disease modifying therapies and follow closely with MRIs.  I discussed with her that many patients who have had MS for many years and are in their 56s have been able to stop disease modifying therapies without return of disease activity.  If there is a relapse or new changes on MRI, however, we would want to reinitiate therapy.  She would like to go off of her DMT at this time. 2.  We will check an MRI of the brain without contrast and with her previous one.  This will also serve as her baseline MRI going forward to determine if she has any new lesions while off of a disease modifying therapy.  She is claustrophobic and I will send in a prescription for low-dose Xanax. 3.   Amantadine for fatigue 4.   Return in 6 months or sooner if there are new or worsening neurologic symptoms.   45-minute office visit with the majority of the time spent face-to-face for history and physical, discussion/counseling and decision-making.  Additional time with record review and documentation.  Malacai Grantz A. Epimenio Foot, MD, PhD, FAAN Certified in Neurology, Clinical Neurophysiology, Sleep Medicine, Pain Medicine and Neuroimaging Director, Multiple Sclerosis [facility] at [facility]  [facility] [address] [place], [place] [address] [phone]

## 2019-09-22 ENCOUNTER — Telehealth: Payer: Self-pay | Admitting: Neurology

## 2019-09-22 NOTE — Telephone Encounter (Signed)
Medicare/bcbs supp order sent to GI. No auth they will reach out to the patient to schedule.  

## 2019-09-23 ENCOUNTER — Telehealth: Payer: Self-pay | Admitting: *Deleted

## 2019-09-23 NOTE — Telephone Encounter (Signed)
Took call from phone staff and spoke with pt. Went over plan again with her, that she and MD decided to have her stay off DMT for MS to see how she does. She is scheduled for MRI's. We will contact her about results after she completes. She verbalized understanding.

## 2019-10-22 ENCOUNTER — Ambulatory Visit
Admission: RE | Admit: 2019-10-22 | Discharge: 2019-10-22 | Disposition: A | Discharge status: Home / Self Care | Payer: Medicare Other | Source: Ambulatory Visit | Attending: Neurology | Admitting: Neurology

## 2019-10-22 ENCOUNTER — Other Ambulatory Visit: Payer: Self-pay

## 2019-10-22 DIAGNOSIS — G35 Multiple sclerosis: Secondary | ICD-10-CM

## 2019-10-27 ENCOUNTER — Telehealth: Payer: Self-pay | Admitting: *Deleted

## 2019-10-27 NOTE — Telephone Encounter (Signed)
Called, LVM for pt about results per Dr. Epimenio Foot note. Provided office number if she has further questions.

## 2019-10-27 NOTE — Telephone Encounter (Signed)
-----   Message from Asa Lente, MD sent at 10/24/2019  3:00 PM EDT ----- The MRI showed old MS lesions but nothing looked new.   We will check another one in about a year -- hopefully there eill not be nay new lesions while off her MS medication

## 2019-10-30 ENCOUNTER — Encounter: Payer: Self-pay | Admitting: Neurology

## 2019-10-30 ENCOUNTER — Other Ambulatory Visit: Payer: Self-pay

## 2019-10-30 ENCOUNTER — Ambulatory Visit (INDEPENDENT_AMBULATORY_CARE_PROVIDER_SITE_OTHER): Payer: Medicare Other | Admitting: Neurology

## 2019-10-30 VITALS — BP 190/85 | HR 67 | Wt 178.0 lb

## 2019-10-30 DIAGNOSIS — R269 Unspecified abnormalities of gait and mobility: Secondary | ICD-10-CM

## 2019-10-30 DIAGNOSIS — R5383 Other fatigue: Secondary | ICD-10-CM | POA: Diagnosis not present

## 2019-10-30 DIAGNOSIS — G35 Multiple sclerosis: Secondary | ICD-10-CM

## 2019-10-30 DIAGNOSIS — N393 Stress incontinence (female) (male): Secondary | ICD-10-CM | POA: Diagnosis not present

## 2019-10-30 NOTE — Progress Notes (Signed)
[facility]  PATIENT: Kathy Nelson DOB: 07/04/1945  REFERRING DOCTOR OR PCP: [facility] is [facility] in [place], referred by Dr. Lily Peer FNP SOURCE: Patient, notes from [facility] neurology, imaging reports, MRI images personally reviewed.  _________________________________   HISTORICAL  CHIEF COMPLAINT:  Chief Complaint  Patient presents with  . Follow-up    RM 12, alone.  . Multiple Sclerosis    Off DMT    HISTORY OF PRESENT ILLNESS:  Kathy Nelson is a 74 y.o. woman with multiple sclerosis.  Update 10/30/2019: She is no longer on a DMT for her MS taking Aubagio last around 07/2019.    She has no new symptoms.   She notes numbness and tingling with only mild improvement in her pain with lamotrigine.  She  Tolerates it well.    She is also on gabapentin.   .  She still has a tight sensation in her feet. Marland Kitchen   However, her gait is poor and she uses a cane.  She has a recent fall (no injury but more pain).  She notes her bladder function is mildly worse but she adjusts to it.   No incontinence.   Her vision is worse   She recently saw ophthalmology and she was told glasses were optimal.    She started modafinil but it has not helped her fatigue.  She often takes a nap in the afternoon.     Update 09/18/2019 She had been on Aubagio (from 07/2018 to 07/2019) but was unable to get copay support and her portion would have been 2000/month.    She has not taken any over the past year  She reports her gait is doing worse.    She gets out of breath easily when she walks.   She feels she gets more fatigue and notes being more sleepy.  She feels Gait is worse --- slowly worsened   Currently, she iuses a cane for balance as needed but walks without one inside the house   She denies much weakness She also has left hip > knee pain which bothers her more when walking.  .  She is on gabapentin for dysesthesia.  She was still having a lot of dysesthesias last year  but is better now.  We had prescribed lamotrigine but she never started,.   She has some urinary leakage but no frank incontinence.   She has had some visual blurring but notes h/o cataracts and laser operations.    Colors are desaturated OD since she had ON in 1987.   She takes Provigil 150 mg bid for fatigue with mild benefit.    She had done PT for her gait when she got to [facility] but stopped as there was no benefit.      From 01/15/2020: She was diagnosed with MS in 1991 (Dr. Desmond Lope in [place]) after presenting with flank dysesthesias. In retrospect she had optic neuritis in 1987.    She was first told she might have cholecystitis.   Then Imaging studies were consistent with MS.   CSF was also consistent with MS.   She was placed on Avonex and remained on it x many years.    She did not have any exacerbations.  She moved to [facility] and started to see Dr. Imelda Pillow.  When he retired, she started to see Dr. Pamalee Leyden.     More recently, she was placed on Aubagio (March 2020).    MS One to One needs a new start  form.   She has been doing monthly IV Solu-Medrol for the past year.     Gait has slowly worsened   Currently, she is off balanced and uses a cane as needed but walks without one much of the time.   She denies much weakness but notes more issues with her left leg due to left knee pain (she already had right TKR).   She has right flank tight dysesthetic sensations.  She also has  A swollen sensation in her left hand.     She takes gabapentin and is unsure how much it helps.   She has some urinary leakage but no frank incontinence.   She has had some visual blurring but notes h/o cataracts and laser operations.    Colors are desaturated OD since she had ON in 1987 Marland KitchenGorden Harms sees her).    She has fatigue and does better with modafinil.   She has some insomnia and can't turn her brain off some nights.    She has depression,a little worse during the pandemic as her assisted living is 'locked  down' and there are no social activities.   Cognition is doing well.   Other medical issues:   She is noting more aches and pains in her neck, lower back and left knee.  She had lumbar surgery L4L5 in the past.   She has hypertension and atorvastatin.     REVIEW OF SYSTEMS: Constitutional: No fevers, chills, sweats, or change in appetite.  She has fatigue.   Eyes: No visual changes, double vision, eye pain Ear, nose and throat: No hearing loss, ear pain, nasal congestion, sore throat Cardiovascular: No chest pain, palpitations Respiratory: No shortness of breath at rest or with exertion.   No wheezes GastrointestinaI: No nausea, vomiting, diarrhea, abdominal pain, fecal incontinence Genitourinary: No dysuria, urinary retention or frequency.  No nocturia. Musculoskeletal:She has neck pain, low back pain and left knee pain.  She has a history of lumbar surgery at L4-L5.   Integumentary: No rash, pruritus, skin lesions Neurological: as above Psychiatric: She has had depression. Endocrine: No palpitations, diaphoresis, change in appetite, change in weigh or increased thirst Hematologic/Lymphatic: No anemia, purpura, petechiae. Allergic/Immunologic: No itchy/runny eyes, nasal congestion, recent allergic reactions, rashes  ALLERGIES: Allergies  Allergen Reactions  . Penicillins Rash    Was told by her mother she was allergic to Penicillin Was told by her mother she was allergic to Penicillin     HOME MEDICATIONS:  Current Outpatient Medications:  .  ALPRAZolam (XANAX) 0.5 MG tablet, Take one or two pills before the MRi, Disp: 2 tablet, Rfl: 0 .  amLODipine (NORVASC) 5 MG tablet, Take 5 mg by mouth daily., Disp: , Rfl:  .  atorvastatin (LIPITOR) 20 MG tablet, Take 1 tablet (20 mg total) by mouth daily., Disp: 90 tablet, Rfl: 3 .  BIOTIN PO, Take 1,000 mg by mouth daily. RNA Biotin, Disp: , Rfl:  .  buPROPion (WELLBUTRIN XL) 300 MG 24 hr tablet, Take 300 mg by mouth daily., Disp: ,  Rfl:  .  Cholecalciferol (VITAMIN D3 PO), Take 7,000 Units by mouth daily. Vit D3, Disp: , Rfl:  .  FLUoxetine (PROZAC) 20 MG tablet, Take 20 mg by mouth daily., Disp: , Rfl:  .  gabapentin (NEURONTIN) 600 MG tablet, Take 600 mg by mouth 3 (three) times daily., Disp: , Rfl:  .  ibuprofen (ADVIL) 200 MG tablet, Take 200 mg by mouth every 6 (six) hours as needed., Disp: , Rfl:  .  lamoTRIgine (LAMICTAL) 100 MG tablet, Take 100 mg by mouth 2 (two) times daily., Disp: , Rfl:  .  modafinil (PROVIGIL) 100 MG tablet, 1.5 tablets by mouth twice daily, Disp: 270 tablet, Rfl: 1 .  Omega-3 Fatty Acids (FISH OIL) 1000 MG CAPS, Take 1 capsule by mouth daily., Disp: , Rfl:  .  valsartan (DIOVAN) 40 MG tablet, Take 40 mg by mouth daily., Disp: , Rfl:   PAST MEDICAL HISTORY: Past Medical History:  Diagnosis Date  . MS (multiple sclerosis) (HCC)   . Shingles     PAST SURGICAL HISTORY: Past Surgical History:  Procedure Laterality Date  . BACK SURGERY    . BACK SURGERY    . cataract surgery    . dental implants  2014  . KNEE SURGERY    . REPLACEMENT TOTAL KNEE  2006  . ROTATOR CUFF REPAIR      FAMILY HISTORY: History reviewed. No pertinent family history.  SOCIAL HISTORY:  Social History   Socioeconomic History  . Marital status: Widowed    Spouse name: Not on file  . Number of children: Not on file  . Years of education: Not on file  . Highest education level: Not on file  Occupational History  . Not on file  Tobacco Use  . Smoking status: Never Smoker  . Smokeless tobacco: Never Used  Substance and Sexual Activity  . Alcohol use: Yes    Comment: Daily with dinner - wine  . Drug use: Never  . Sexual activity: Not on file  Other Topics Concern  . Not on file  Social History Narrative   Lives alone   No caffeine use   Right handed    Social Determinants of Health   Financial Resource Strain:   . Difficulty of Paying Living Expenses:   Food Insecurity:   . Worried About  Programme researcher, broadcasting/film/video in the Last Year:   . Barista in the Last Year:   Transportation Needs:   . Freight forwarder (Medical):   Marland Kitchen Lack of Transportation (Non-Medical):   Physical Activity:   . Days of Exercise per Week:   . Minutes of Exercise per Session:   Stress:   . Feeling of Stress :   Social Connections:   . Frequency of Communication with Friends and Family:   . Frequency of Social Gatherings with Friends and Family:   . Attends Religious Services:   . Active Member of Clubs or Organizations:   . Attends Banker Meetings:   Marland Kitchen Marital Status:   Intimate Partner Violence:   . Fear of Current or Ex-Partner:   . Emotionally Abused:   Marland Kitchen Physically Abused:   . Sexually Abused:      PHYSICAL EXAM  Vitals:   10/30/19 0955  BP: (!) 190/85  Pulse: 67  SpO2: 97%  Weight: 178 lb (80.7 kg)    Body mass index is 28.73 kg/m.   General: The patient is well-developed and well-nourished and in no acute distress  HEENT:  Head is [place]/AT.  Sclera are anicteric.   Skin: Extremities are without rash or  edema.  Neurologic Exam  Mental status: The patient is alert and oriented x 3 at the time of the examination. The patient has apparent normal recent and remote memory, with an apparently normal attention span and concentration ability.   Speech is normal.  Cranial nerves: Extraocular movements are full.  She has reduced color vision OD.  Facial symmetry is  present. There is good facial sensation to soft touch bilaterally.Facial strength is normal.  Trapezius and sternocleidomastoid strength is normal. No dysarthria is noted.  The tongue is midline, and the patient has symmetric elevation of the soft palate. No obvious hearing deficits are noted.  Motor:  Muscle bulk is normal.   Tone is normal. Strength is  5 / 5 in all 4 extremities.   Sensory: Sensory testing is intact to pinprick, soft touch and vibration sensation in arms and trunk but reduced in right  leg.     Coordination: Cerebellar testing reveals good finger-nose-finger and heel-to-shin bilaterally.  Gait and station: Station is normal.   Gait is slightly wide. She can't tandem walk. Romberg is negative.   Reflexes: Deep tendon reflexes are symmetric and normal bilaterally.       ASSESSMENT AND PLAN     1. Multiple sclerosis (HCC)   2. Gait disturbance   3. Other fatigue   4. Stress incontinence, female     1.  Remain off Aubagio - last brain MRi in April 2021 showed no new lesion.   2.  Stay active and exercise 3.   Stop amantadine and consider a trial off Provigil to see if it is helping, stop if it has not been helping or go back on if it has.    4.   Return in 5 months or sooner if there are new or worsening neurologic symptoms.  Ikram Riebe A. Epimenio Foot, MD, PhD, FAAN Certified in Neurology, Clinical Neurophysiology, Sleep Medicine, Pain Medicine and Neuroimaging Director, Multiple Sclerosis Center at [facility]  [facility] [address], [address] [place], [place] [address] [phone]

## 2020-02-17 ENCOUNTER — Telehealth: Payer: Self-pay | Admitting: Neurology

## 2020-02-17 NOTE — Telephone Encounter (Signed)
Called pt. Scheduled work in appt for 02/19/20 at 9am with Dr. Epimenio Foot. Asked she check in by 830am, wear mask and bring insurance cards. She verbalized understanding.

## 2020-02-17 NOTE — Telephone Encounter (Signed)
Pt states her balance is worsening and so are her legs and feet.  Pt was told that Amy,NP is currently booking in the month of November and there is nothing available at this time before her current appointment.  Please call if there is something Amy, NP can suggest she does while waiting to be seen.

## 2020-02-19 ENCOUNTER — Encounter: Payer: Self-pay | Admitting: Neurology

## 2020-02-19 ENCOUNTER — Ambulatory Visit (INDEPENDENT_AMBULATORY_CARE_PROVIDER_SITE_OTHER): Payer: Medicare Other | Admitting: Neurology

## 2020-02-19 VITALS — BP 135/85 | HR 66 | Ht 66.0 in | Wt 179.5 lb

## 2020-02-19 DIAGNOSIS — R269 Unspecified abnormalities of gait and mobility: Secondary | ICD-10-CM

## 2020-02-19 DIAGNOSIS — N393 Stress incontinence (female) (male): Secondary | ICD-10-CM | POA: Diagnosis not present

## 2020-02-19 DIAGNOSIS — R5383 Other fatigue: Secondary | ICD-10-CM

## 2020-02-19 DIAGNOSIS — G35 Multiple sclerosis: Secondary | ICD-10-CM | POA: Diagnosis not present

## 2020-02-19 DIAGNOSIS — F329 Major depressive disorder, single episode, unspecified: Secondary | ICD-10-CM

## 2020-02-19 NOTE — Progress Notes (Signed)
[facility]  PATIENT: Kathy Nelson DOB: [birth year]  REFERRING DOCTOR OR PCP: [facility] is [facility] in [place], referred by Dr. Coral Else FNP SOURCE: Patient, notes from [facility] neurology, imaging reports, MRI images personally reviewed.  _________________________________   HISTORICAL  CHIEF COMPLAINT:  Chief Complaint  Patient presents with  . Follow-up    RM 12, alone. Last seen 10/30/19  . Multiple Sclerosis    Off DMT. Here to be evaluated for worsening gait. Wants to talk about coming off some medications.   . Gait Problem    Ambulates with cane.    HISTORY OF PRESENT ILLNESS:  Kathy Nelson is a 74 y.o. woman with multiple sclerosis.  Update 02/19/2020: She feels her balance is doing worse.  She has fallen 6 times since her last visit.  Balance is more of a problem than strength.  She is no longer on a DMT for her MS taking Aubagio last around 07/2019.  She notes more numbness in her toes.    She is on lamotrigine and gabapentin.  She still has some tingling.  .  She still has a tight sensation in her feet. .  She notes her bladder function is mildly worse with some leakage requiring pads..   Her vision is mostly stable    She recently saw ophthalmology and she reports her vision was typical for age.  She has fatigue but is not sure she gets a benefit from modafinil.    She started modafinil but it has not helped her fatigue.  She often takes a nap in the afternoon.    She also reports lower back pain.    She has been vaccinated for [employer]) in Jan/Fe.  We discussed getting the booster.  MS History: She was diagnosed with MS in 65 (Dr. Dortha Schwalbe in [place]) after presenting with flank dysesthesias. In retrospect she had optic neuritis in 1987.    She was first told she might have cholecystitis.   Then Imaging studies were consistent with MS.   CSF was also consistent with MS.   She was placed on Avonex and remained on it x many  years.    She did not have any exacerbations.  She moved to [facility] and started to see Dr. Freda Munro.  When he retired, she started to see Dr. Vernie Ammons.    She has had a more SPMS course since 2015 or so with slow worsening of gait and balance.     REVIEW OF SYSTEMS: Constitutional: No fevers, chills, sweats, or change in appetite.  She has fatigue.   Eyes: No visual changes, double vision, eye pain Ear, nose and throat: No hearing loss, ear pain, nasal congestion, sore throat Cardiovascular: No chest pain, palpitations Respiratory: No shortness of breath at rest or with exertion.   No wheezes GastrointestinaI: No nausea, vomiting, diarrhea, abdominal pain, fecal incontinence Genitourinary: No dysuria, urinary retention or frequency.  No nocturia. Musculoskeletal:She has neck pain, low back pain and left knee pain.  She has a history of lumbar surgery at L4-L5.   Integumentary: No rash, pruritus, skin lesions Neurological: as above Psychiatric: She has had depression. Endocrine: No palpitations, diaphoresis, change in appetite, change in weigh or increased thirst Hematologic/Lymphatic: No anemia, purpura, petechiae. Allergic/Immunologic: No itchy/runny eyes, nasal congestion, recent allergic reactions, rashes  ALLERGIES: Allergies  Allergen Reactions  . Penicillins Rash    Was told by her mother she was allergic to Penicillin Was told by her mother she was allergic  to Penicillin     HOME MEDICATIONS:  Current Outpatient Medications:  .  ALPRAZolam (XANAX) 0.5 MG tablet, Take one or two pills before the MRi, Disp: 2 tablet, Rfl: 0 .  amLODipine (NORVASC) 5 MG tablet, Take 5 mg by mouth daily., Disp: , Rfl:  .  atorvastatin (LIPITOR) 20 MG tablet, Take 1 tablet (20 mg total) by mouth daily., Disp: 90 tablet, Rfl: 3 .  BIOTIN PO, Take 1,000 mg by mouth daily. RNA Biotin, Disp: , Rfl:  .  buPROPion (WELLBUTRIN XL) 300 MG 24 hr tablet, Take 300 mg by mouth daily., Disp: , Rfl:  .   Cholecalciferol (VITAMIN D3 PO), Take 7,000 Units by mouth daily. Vit D3, Disp: , Rfl:  .  FLUoxetine (PROZAC) 20 MG tablet, Take 20 mg by mouth daily., Disp: , Rfl:  .  gabapentin (NEURONTIN) 600 MG tablet, Take 600 mg by mouth 3 (three) times daily., Disp: , Rfl:  .  ibuprofen (ADVIL) 200 MG tablet, Take 200 mg by mouth every 6 (six) hours as needed., Disp: , Rfl:  .  lamoTRIgine (LAMICTAL) 100 MG tablet, Take 100 mg by mouth 2 (two) times daily., Disp: , Rfl:  .  modafinil (PROVIGIL) 100 MG tablet, 1.5 tablets by mouth twice daily, Disp: 270 tablet, Rfl: 1 .  Omega-3 Fatty Acids (FISH OIL) 1000 MG CAPS, Take 1 capsule by mouth daily., Disp: , Rfl:  .  valsartan (DIOVAN) 40 MG tablet, Take 40 mg by mouth daily., Disp: , Rfl:   PAST MEDICAL HISTORY: Past Medical History:  Diagnosis Date  . MS (multiple sclerosis) (HCC)   . Shingles     PAST SURGICAL HISTORY: Past Surgical History:  Procedure Laterality Date  . BACK SURGERY    . BACK SURGERY    . cataract surgery    . dental implants  2014  . KNEE SURGERY    . REPLACEMENT TOTAL KNEE  2006  . ROTATOR CUFF REPAIR      FAMILY HISTORY: No family history on file.  SOCIAL HISTORY:  Social History   Socioeconomic History  . Marital status: Widowed    Spouse name: Not on file  . Number of children: Not on file  . Years of education: Not on file  . Highest education level: Not on file  Occupational History  . Not on file  Tobacco Use  . Smoking status: Never Smoker  . Smokeless tobacco: Never Used  Substance and Sexual Activity  . Alcohol use: Yes    Comment: Daily with dinner - wine  . Drug use: Never  . Sexual activity: Not on file  Other Topics Concern  . Not on file  Social History Narrative   Lives alone   No caffeine use   Right handed    Social Determinants of Health   Financial Resource Strain:   . Difficulty of Paying Living Expenses: Not on file  Food Insecurity:   . Worried About Programme researcher, broadcasting/film/video  in the Last Year: Not on file  . Ran Out of Food in the Last Year: Not on file  Transportation Needs:   . Lack of Transportation (Medical): Not on file  . Lack of Transportation (Non-Medical): Not on file  Physical Activity:   . Days of Exercise per Week: Not on file  . Minutes of Exercise per Session: Not on file  Stress:   . Feeling of Stress : Not on file  Social Connections:   . Frequency of Communication with Friends  and Family: Not on file  . Frequency of Social Gatherings with Friends and Family: Not on file  . Attends Religious Services: Not on file  . Active Member of Clubs or Organizations: Not on file  . Attends Banker Meetings: Not on file  . Marital Status: Not on file  Intimate Partner Violence:   . Fear of Current or Ex-Partner: Not on file  . Emotionally Abused: Not on file  . Physically Abused: Not on file  . Sexually Abused: Not on file     PHYSICAL EXAM  Vitals:   02/19/20 0942  BP: 135/85  Pulse: 66  SpO2: 97%  Weight: 179 lb 8 oz (81.4 kg)  Height: 5\' 6"  (1.676 m)    Body mass index is 28.97 kg/m.   General: The patient is well-developed and well-nourished and in no acute distress  HEENT:  Head is [place]/AT.  Sclera are anicteric.   Skin: Extremities are without rash or  edema.  Neurologic Exam  Mental status: The patient is alert and oriented x 3 at the time of the examination. The patient has apparent normal recent and remote memory.  Attention was reduced.   Speech is normal.  Cranial nerves: Extraocular movements are full.  She has reduced color vision OD.  Facial symmetry is present.  Facial strength is normal.  Trapezius and sternocleidomastoid strength is normal. No dysarthria is noted.  No obvious hearing deficits are noted.  Motor:  Muscle bulk is normal.   Tone is normal. Strength is  5 / 5 in all 4 extremities.   Sensory: Sensory testing is intact to pinprick, soft touch and vibration sensation in arms and trunk but  reduced in right leg.     Coordination: Cerebellar testing reveals good finger-nose-finger and heel-to-shin bilaterally.  Gait and station: Station is normal.   Gait is slightly wide but stride was fairly normal and turn was 3 steps. She can't tandem walk. Romberg is negative.   Reflexes: Deep tendon reflexes are symmetric and normal bilaterally.       ASSESSMENT AND PLAN     1. Multiple sclerosis (HCC)   2. Gait disturbance   3. Other fatigue   4. Stress incontinence, female   5. Major depressive disorder, remission status unspecified, unspecified whether recurrent     1.  Remain off Aubagio - last brain MRi in April 2021 showed no new lesion.   2.  Stay active and exercise 3.   Stop lamotrigine and reduce gabapentin to 300-300-600 to see if balance improves.May 2021 to stop biotin.   We discussed stopping Provigil for a few days to see if there was much benefit while on it - if not stay off 4.   Return in 6 months or sooner if there are new or worsening neurologic symptoms.  Jared Cahn A. Rip Harbour, MD, PhD, FAAN Certified in Neurology, Clinical Neurophysiology, Sleep Medicine, Pain Medicine and Neuroimaging Director, Multiple Sclerosis Center at [facility]  [facility] [address] [place], [place] [phone]

## 2020-02-24 ENCOUNTER — Telehealth: Payer: Self-pay | Admitting: Neurology

## 2020-02-24 MED ORDER — GABAPENTIN 600 MG PO TABS: ORAL_TABLET | ORAL | 11 refills | Status: DC

## 2020-02-24 NOTE — Telephone Encounter (Signed)
Pt ask if she should be taking BIOTIN PO. Would like a call from the nurse.

## 2020-02-24 NOTE — Telephone Encounter (Signed)
See other phone note

## 2020-02-24 NOTE — Telephone Encounter (Signed)
Pt request refill gabapentin (NEURONTIN) 600 MG tablet at [facility] #[id number]

## 2020-02-24 NOTE — Telephone Encounter (Addendum)
Annie in phone room also sent this re pt: "Pt ask if she should be taking BIOTIN PO. Would like a call from the nurse."Spoke with MD. There was a smaller study showing Biotin was beneficial for MS pt's but a larger study showed no benefit.   Received VO from MD to call in gabapentin 600mg , take 1/2-1 tablet po TID. #90, 11 refills. I e-scribed to the pharmacy for pt. Called and LVM for pt relaying above info.

## 2020-03-11 ENCOUNTER — Other Ambulatory Visit: Payer: Self-pay | Admitting: *Deleted

## 2020-03-11 MED ORDER — MODAFINIL 100 MG PO TABS: ORAL_TABLET | ORAL | 1 refills | Status: DC

## 2020-03-22 ENCOUNTER — Other Ambulatory Visit: Payer: Self-pay | Admitting: Neurology

## 2020-03-23 ENCOUNTER — Ambulatory Visit: Payer: Medicare Other | Admitting: Family Medicine

## 2020-07-05 ENCOUNTER — Other Ambulatory Visit: Payer: Self-pay | Admitting: *Deleted

## 2020-07-05 ENCOUNTER — Telehealth: Payer: Self-pay | Admitting: Neurology

## 2020-07-05 MED ORDER — ATORVASTATIN CALCIUM 20 MG PO TABS: 20.0000 mg | ORAL_TABLET | Freq: Every day | ORAL | 2 refills | Status: DC

## 2020-07-05 NOTE — Telephone Encounter (Signed)
Pt has called for a refill on her atorvastatin (LIPITOR) 20 MG tablet to [facility] #[id number] Pt has new insurance Carrier:[employer] Member JT:[id number] [phone] Health [id number] Rx LHT#[id number] Rx PCN#[id number]  Rx Group#[id number]

## 2020-07-05 NOTE — Telephone Encounter (Signed)
E-scribed refill as requested. 

## 2020-07-06 ENCOUNTER — Other Ambulatory Visit: Payer: Self-pay | Admitting: Neurology

## 2020-07-06 MED ORDER — MODAFINIL 100 MG PO TABS: ORAL_TABLET | ORAL | 0 refills | Status: DC

## 2020-07-06 NOTE — Telephone Encounter (Addendum)
Reviewed pt chart. Dr. Epimenio Foot last sent rx on 03/11/20 #270 (90days) with 1 refill to [facility]. They should still have a refill on file. I tried calling pt, got busy signal. I called [employer] and spoke with Alycia Rossetti. Transferred me to pharmacist, Sammuel Bailiff. Cx remaining refill on file for Modafinil. Sent request to MD to e-scribe to [facility] per pt request.

## 2020-07-06 NOTE — Telephone Encounter (Signed)
Pt is asking for a refill on her modafinil (PROVIGIL) 100 MG tablet to [facility] [phone]

## 2020-07-06 NOTE — Addendum Note (Signed)
Addended by: Arther Abbott on: 07/06/2020 12:03 PM   Modules accepted: Orders

## 2020-07-07 ENCOUNTER — Telehealth: Payer: Self-pay | Admitting: *Deleted

## 2020-07-07 NOTE — Telephone Encounter (Signed)
Submitted PA modafinil on CMM. Key: [id number]. Waiting on determination from [employer] Part D. Pt BX:[id number]

## 2020-07-07 NOTE — Telephone Encounter (Signed)
PA s approved for 6 months through 01/04/2021 under Medicare Part D benefit. Reference #: [id number]. Pt ID: [id number]

## 2020-08-19 ENCOUNTER — Encounter: Payer: Self-pay | Admitting: Neurology

## 2020-08-19 ENCOUNTER — Ambulatory Visit (INDEPENDENT_AMBULATORY_CARE_PROVIDER_SITE_OTHER): Payer: Medicare Other | Admitting: Neurology

## 2020-08-19 VITALS — BP 161/80 | HR 64 | Ht 66.0 in | Wt 185.5 lb

## 2020-08-19 DIAGNOSIS — G35 Multiple sclerosis: Secondary | ICD-10-CM | POA: Diagnosis not present

## 2020-08-19 DIAGNOSIS — N393 Stress incontinence (female) (male): Secondary | ICD-10-CM

## 2020-08-19 DIAGNOSIS — R269 Unspecified abnormalities of gait and mobility: Secondary | ICD-10-CM | POA: Diagnosis not present

## 2020-08-19 DIAGNOSIS — R5383 Other fatigue: Secondary | ICD-10-CM

## 2020-08-19 DIAGNOSIS — G4700 Insomnia, unspecified: Secondary | ICD-10-CM

## 2020-08-19 MED ORDER — TOLTERODINE TARTRATE ER 4 MG PO CP24: 4.0000 mg | ORAL_CAPSULE | Freq: Every day | ORAL | 2 refills | Status: DC

## 2020-08-19 MED ORDER — MODAFINIL 200 MG PO TABS: ORAL_TABLET | ORAL | 1 refills | Status: DC

## 2020-08-19 NOTE — Progress Notes (Signed)
[facility]  PATIENT: Kathy Nelson DOB: [birth year]  REFERRING DOCTOR OR PCP: PCP is [facility] in [place], referred by Dr. Coral Else FNP SOURCE: Patient, notes from [facility] neurology, imaging reports, MRI images personally reviewed.  _________________________________   HISTORICAL  CHIEF COMPLAINT:  Chief Complaint  Patient presents with  . Follow-up    RM 12, alone. Last seen 02/19/2020. Off DMT for MS.     HISTORY OF PRESENT ILLNESS:  [name] is a 75 y.o. woman with multiple sclerosis.  Update 08/19/2020: She is no longer on a DMT for her MS taking Aubagio last around 07/2019.  She has no exacerbations off the medication.      She is noting much more sleepiness during the day.    She sleeps poorly.waking up a fair amount - sometimes for bathroom and sometimes just a lot on her mind.   She has some problems falling asleep some nights but most nights falls asleep easily.     She snores. Nobody has noted pauses/gasps.      What usually happens is she feels sleepy and then goes to bed for a nap.  She naps everyday at least once..   She rarely takes a Unisom at bedtime with benefit.  She has fatigue, helped by modafinil.    She is walking the same with an unsteady gait.  She uses a cane most of the time outside her home.  She has occasional falls.    She has dysesthesias and is on gabapentin (take 1 - 1/2 - 1/2) --- we discussed taking higher dose at night . Dysesthesias are better. When she ran out of lamotrigine shs did not note a difference and stopped.   She has urinary urgency and leakage.   Her vision is mostly stable    She saw ophthalmology yesterday and will be getting new glasses.  She also reports lower back pain.    She has been vaccinated for [employer]) in Jan/Fe.  We discussed getting the booster.  MS History: She was diagnosed with MS in 31 (Dr. Dortha Schwalbe in [place]) after presenting with flank dysesthesias. In  retrospect she had optic neuritis in 1987.    She was first told she might have cholecystitis.   Then Imaging studies were consistent with MS.   CSF was also consistent with MS.   She was placed on Avonex and remained on it x many years.    She did not have any exacerbations.  She moved to [facility] and started to see Dr. Freda Munro.  When he retired, she started to see Dr. Vernie Ammons.    She has had a more SPMS course since 2015 or so with slow worsening of gait and balance.    IMAGING The MRI of the brain 11/23/2017 shows typical T2/flair hyperintense foci in the periventricular, juxtacortical deep white matter of the hemispheres consistent with MS.  There is cortical atrophy.   MRI of the cervical spine 11/23/2017 shows no definite MS plaques.  Cervical degenerative changes noted at C3-C4, C4-C5, C5-C6 and C6-C7.  There is bilateral foraminal narrowing at C5-C6 and left greater than right foraminal narrowing at C6-C7  MRI of the thoracic spine 11/23/2017 shows several T2 hyperintense foci.   Unfortunately, the CD PACS does not have the ability to assess spinal levels with scout lines.  In this context, there appears to be a focus at T3-T4, T5-T6 and T8-T9 towards the left.  MRI of the brain 09/2019 showed no new  lesions  REVIEW OF SYSTEMS: Constitutional: No fevers, chills, sweats, or change in appetite.  She has fatigue.   Eyes: No visual changes, double vision, eye pain Ear, nose and throat: No hearing loss, ear pain, nasal congestion, sore throat Cardiovascular: No chest pain, palpitations Respiratory: No shortness of breath at rest or with exertion.   No wheezes GastrointestinaI: No nausea, vomiting, diarrhea, abdominal pain, fecal incontinence Genitourinary: No dysuria, urinary retention or frequency.  No nocturia. Musculoskeletal:She has neck pain, low back pain and left knee pain.  She has a history of lumbar surgery at L4-L5.   Integumentary: No rash, pruritus, skin  lesions Neurological: as above Psychiatric: She has had depression. Endocrine: No palpitations, diaphoresis, change in appetite, change in weigh or increased thirst Hematologic/Lymphatic: No anemia, purpura, petechiae. Allergic/Immunologic: No itchy/runny eyes, nasal congestion, recent allergic reactions, rashes  ALLERGIES: Allergies  Allergen Reactions  . Penicillins Rash    Was told by her mother she was allergic to Penicillin Was told by her mother she was allergic to Penicillin     HOME MEDICATIONS:  Current Outpatient Medications:  .  atorvastatin (LIPITOR) 20 MG tablet, Take 1 tablet (20 mg total) by mouth daily., Disp: 90 tablet, Rfl: 2 .  buPROPion (WELLBUTRIN XL) 300 MG 24 hr tablet, Take 300 mg by mouth daily., Disp: , Rfl:  .  Cholecalciferol (VITAMIN D3 PO), Take 7,000 Units by mouth daily. Vit D3, Disp: , Rfl:  .  FLUoxetine (PROZAC) 20 MG tablet, Take 20 mg by mouth daily., Disp: , Rfl:  .  gabapentin (NEURONTIN) 600 MG tablet, Take 1/2-1 tablet by mouth three times daily, Disp: 90 tablet, Rfl: 11 .  ibuprofen (ADVIL) 200 MG tablet, Take 200 mg by mouth every 6 (six) hours as needed., Disp: , Rfl:  .  Omega-3 Fatty Acids (FISH OIL) 1000 MG CAPS, Take 1 capsule by mouth daily., Disp: , Rfl:  .  tolterodine (DETROL LA) 4 MG 24 hr capsule, Take 1 capsule (4 mg total) by mouth daily., Disp: 90 capsule, Rfl: 2 .  valsartan (DIOVAN) 40 MG tablet, Take 40 mg by mouth daily., Disp: , Rfl:  .  modafinil (PROVIGIL) 200 MG tablet, 1/2 to one po qAM and q noon, Disp: 180 tablet, Rfl: 1  PAST MEDICAL HISTORY: Past Medical History:  Diagnosis Date  . MS (multiple sclerosis) (HCC)   . Shingles     PAST SURGICAL HISTORY: Past Surgical History:  Procedure Laterality Date  . BACK SURGERY    . BACK SURGERY    . cataract surgery    . dental implants  2014  . KNEE SURGERY    . REPLACEMENT TOTAL KNEE  2006  . ROTATOR CUFF REPAIR      FAMILY HISTORY: History reviewed. No  pertinent family history.  SOCIAL HISTORY:  Social History   Socioeconomic History  . Marital status: Widowed    Spouse name: Not on file  . Number of children: Not on file  . Years of education: Not on file  . Highest education level: Not on file  Occupational History  . Not on file  Tobacco Use  . Smoking status: Never Smoker  . Smokeless tobacco: Never Used  Substance and Sexual Activity  . Alcohol use: Yes    Comment: Daily with dinner - wine  . Drug use: Never  . Sexual activity: Not on file  Other Topics Concern  . Not on file  Social History Narrative   Lives alone   No caffeine  use   Right handed    Social Determinants of Health   Financial Resource Strain: Not on file  Food Insecurity: Not on file  Transportation Needs: Not on file  Physical Activity: Not on file  Stress: Not on file  Social Connections: Not on file  Intimate Partner Violence: Not on file     PHYSICAL EXAM  Vitals:   08/19/20 1053  BP: (!) 161/80  Pulse: 64  SpO2: 97%  Weight: 185 lb 8 oz (84.1 kg)  Height: 5\' 6"  (1.676 m)    Body mass index is 29.94 kg/m.   General: The patient is well-developed and well-nourished and in no acute distres  Skin: Extremities are without rash or edema.  Neurologic Exam  Mental status: The patient is alert and oriented x 3 at the time of the examination. The patient has apparent normal recent and remote memory.  Attention was reduced.   Speech is normal.  Cranial nerves: Extraocular movements are full.  She has reduced color vision OD.  Facial symmetry is present.  Facial strength is normal.  Trapezius and sternocleidomastoid strength is normal. No dysarthria is noted.  No obvious hearing deficits are noted.  Motor:  Muscle bulk is normal.   Tone is normal. Strength is  5 / 5 in all 4 extremities except 4+/5 left ankle/toe extension..   Sensory: Sensory testing is intact to pinprick, soft touch and vibration sensation in arms and trunk but  reduced in right leg.     Coordination: Finger-nose-finger is normal and heel-to-shin is performed appropriately for strength  Gait and station: Station is normal.   Gait is slightly wide but stride was fairly normal and turn was 3 steps. Unable to walk. Romberg is negative.   Reflexes: Deep tendon reflexes are symmetric and normal bilaterally.       ASSESSMENT AND PLAN     1. Multiple sclerosis (HCC)   2. Gait disturbance   3. Stress incontinence, female   4. Other fatigue   5. Insomnia, unspecified type     1.  Remain off Aubagio.  Around the time of the next visit consider another MRI to determine if she has no clinical progression.  If clinical progression is occurring we would need to consider restarting a DMT 2.  Stay active and exercise 3.  Reduce gabapentin to 300 in PM and 600 mg at night.    Renew modafinil - change to 200 in am and 100 in pm.  Add a bladder medication 4.   Can take NSAIDs for knee pain -  Plans to see Ortho. 5.   If sleepiness worsens, consider PSG 6.   Return in 6 months or sooner if there are new or worsening neurologic symptoms.  Launi Asencio A. Marland Kitchen, MD, PhD, FAAN Certified in Neurology, Clinical Neurophysiology, Sleep Medicine, Pain Medicine and Neuroimaging Director, [facility] at [facility]  [facility] [address] [place], [place] [phone]

## 2020-09-29 ENCOUNTER — Other Ambulatory Visit: Payer: Self-pay | Admitting: Neurology

## 2020-09-29 MED ORDER — MODAFINIL 200 MG PO TABS: ORAL_TABLET | ORAL | 1 refills | Status: DC

## 2020-09-29 NOTE — Addendum Note (Signed)
Addended by: Arther Abbott on: 09/29/2020 03:35 PM   Modules accepted: Orders

## 2020-09-29 NOTE — Telephone Encounter (Signed)
Pt is requesting a refill for modafinil (PROVIGIL) 100 MG tablet .  Pharmacy: [facility] #[id number]  

## 2020-10-04 ENCOUNTER — Telehealth: Payer: Self-pay | Admitting: Neurology

## 2020-10-04 NOTE — Telephone Encounter (Signed)
Pt is requesting a refill for modafinil (PROVIGIL) 100 MG tablet .  Pharmacy: [facility] [phone]

## 2020-10-04 NOTE — Telephone Encounter (Signed)
I called [facility] and confirmed rx ready for pick up. I called pt and informed her. She thought she should be picking up modafinil 100mg , not 200mg . Advised per message from 08/23/20: "Change modafinil to 200mg  (1 tablet) in am and 100mg  (1/2 tablet) in pm" She wrote down these directions. She will pick up script that is ready for her, nothing further needed.

## 2020-10-05 ENCOUNTER — Other Ambulatory Visit: Payer: Self-pay | Admitting: *Deleted

## 2020-10-05 DIAGNOSIS — N393 Stress incontinence (female) (male): Secondary | ICD-10-CM

## 2020-10-05 DIAGNOSIS — N3281 Overactive bladder: Secondary | ICD-10-CM

## 2020-10-05 MED ORDER — SOLIFENACIN SUCCINATE 10 MG PO TABS: 10.0000 mg | ORAL_TABLET | Freq: Every day | ORAL | 5 refills | Status: DC

## 2020-12-07 ENCOUNTER — Other Ambulatory Visit: Payer: Self-pay

## 2020-12-07 ENCOUNTER — Emergency Department
Admission: EM | Admit: 2020-12-07 | Discharge: 2020-12-07 | Disposition: A | Discharge status: Home / Self Care | Payer: Medicare Other | Attending: Emergency Medicine | Admitting: Emergency Medicine

## 2020-12-07 ENCOUNTER — Emergency Department: Payer: Medicare Other

## 2020-12-07 DIAGNOSIS — I10 Essential (primary) hypertension: Secondary | ICD-10-CM | POA: Insufficient documentation

## 2020-12-07 DIAGNOSIS — R0602 Shortness of breath: Secondary | ICD-10-CM | POA: Diagnosis not present

## 2020-12-07 DIAGNOSIS — Z96659 Presence of unspecified artificial knee joint: Secondary | ICD-10-CM | POA: Insufficient documentation

## 2020-12-07 DIAGNOSIS — R0981 Nasal congestion: Secondary | ICD-10-CM | POA: Diagnosis not present

## 2020-12-07 DIAGNOSIS — Z79899 Other long term (current) drug therapy: Secondary | ICD-10-CM | POA: Diagnosis not present

## 2020-12-07 LAB — CBC
HCT: 38.5 % (ref 36.0–46.0)
Hemoglobin: 12.5 g/dL (ref 12.0–15.0)
MCH: 30.5 pg (ref 26.0–34.0)
MCHC: 32.5 g/dL (ref 30.0–36.0)
MCV: 93.9 fL (ref 80.0–100.0)
Platelets: 201 10*3/uL (ref 150–400)
RBC: 4.1 MIL/uL (ref 3.87–5.11)
RDW: 13.2 % (ref 11.5–15.5)
WBC: 8.9 10*3/uL (ref 4.0–10.5)
nRBC: 0 % (ref 0.0–0.2)

## 2020-12-07 LAB — BASIC METABOLIC PANEL
Anion gap: 6 (ref 5–15)
BUN: 18 mg/dL (ref 8–23)
CO2: 29 mmol/L (ref 22–32)
Calcium: 9.3 mg/dL (ref 8.9–10.3)
Chloride: 104 mmol/L (ref 98–111)
Creatinine, Ser: 0.79 mg/dL (ref 0.44–1.00)
GFR, Estimated: >60 mL/min (ref >60)
Glucose, Bld: 155 mg/dL — ABNORMAL HIGH (ref 70–99)
Potassium: 3.7 mmol/L (ref 3.5–5.1)
Sodium: 139 mmol/L (ref 135–145)

## 2020-12-07 LAB — TROPONIN I (HIGH SENSITIVITY): Troponin I (High Sensitivity): 13 ng/L (ref <18)

## 2020-12-07 MED ORDER — FLUTICASONE PROPIONATE 50 MCG/ACT NA SUSP: 2.0000 | Freq: Every day | NASAL | 0 refills | Status: DC

## 2020-12-07 MED ORDER — AZITHROMYCIN 250 MG PO TABS: ORAL_TABLET | ORAL | 0 refills | Status: DC

## 2020-12-07 NOTE — ED Notes (Signed)
Pt attempted to walk back into nurses' station. Informed she cannot walk there. Demands her paperwork. Pt noted to walk out without but it pt aware of prescriptions being sent to pharmacy.

## 2020-12-07 NOTE — ED Notes (Signed)
Pt unhooked herself and is standing at the doorway clapping her hands and asking to leave. Informed pt that her paperwork is not complete but this RN will come in when MD has finished it.

## 2020-12-07 NOTE — ED Provider Notes (Signed)
[facility] Emergency Department Provider Note  ____________________________________________   Event Date/Time   First MD Initiated Contact with Patient 12/07/20 1633     (approximate)  I have reviewed the triage vital signs and the nursing notes.   HISTORY  Chief Complaint Shortness of Breath    HPI Kathy Nelson is a 75 y.o. female with past medical history as below here with shortness of breath.  Patient states that for the last 2 weeks, she has had progressively worsening sensation of shortness of breath.  She feels like her sinuses have been very congested, dry, and that she cannot get enough air in.  She has been very thirsty and had a dry mouth.  She states that she has been trying phenylephrine without relief. Has noticed her sinuses feel congestion, full, but is not able to get any production of drainage. Has not used a sinus rinse. Denies any recent med changes. No recent illnesses. No fever, chills. No cough.    Past Medical History:  Diagnosis Date   MS (multiple sclerosis) (HCC)    Shingles     Patient Active Problem List   Diagnosis Date Noted   Insomnia 08/19/2020   Other fatigue 09/18/2019   Osteoarthritis of knee 01/15/2019   Gait disturbance 01/15/2019   High risk medication use 01/15/2019   Hyperlipidemia 01/15/2019   Neck pain 07/17/2018   Blurry vision, bilateral 04/09/2018   Essential hypertension 08/03/2017   Major depressive disorder 08/03/2017   Stress incontinence, female 08/03/2017   Vitamin D deficiency 08/03/2017   Multiple sclerosis (HCC) 07/07/2015    Past Surgical History:  Procedure Laterality Date   BACK SURGERY     BACK SURGERY     cataract surgery     dental implants  2014   KNEE SURGERY     REPLACEMENT TOTAL KNEE  2006   ROTATOR CUFF REPAIR      Prior to Admission medications   Medication Sig Start Date End Date Taking? Authorizing Provider  azithromycin (ZITHROMAX Z-PAK) 250 MG tablet Take 2  tablets (500 mg) on  Day 1,  followed by 1 tablet (250 mg) once daily on Days 2 through 5. 12/07/20  Yes Shaune Pollack, MD  fluticasone (FLONASE) 50 MCG/ACT nasal spray Place 2 sprays into both nostrils daily. 12/07/20 01/06/21 Yes Shaune Pollack, MD  atorvastatin (LIPITOR) 20 MG tablet Take 1 tablet (20 mg total) by mouth daily. 07/05/20   Sater, Pearletha Furl, MD  buPROPion (WELLBUTRIN XL) 300 MG 24 hr tablet Take 300 mg by mouth daily.    [provider]  Cholecalciferol (VITAMIN D3 PO) Take 7,000 Units by mouth daily. Vit D3    [provider]  FLUoxetine (PROZAC) 20 MG tablet Take 20 mg by mouth daily.    [provider]  gabapentin (NEURONTIN) 600 MG tablet Take 1/2-1 tablet by mouth three times daily 02/24/20   Sater, Pearletha Furl, MD  ibuprofen (ADVIL) 200 MG tablet Take 200 mg by mouth every 6 (six) hours as needed.    [provider]  modafinil (PROVIGIL) 200 MG tablet 1/2 to one po qAM and q noon 09/29/20   Sater, Pearletha Furl, MD  Omega-3 Fatty Acids (FISH OIL) 1000 MG CAPS Take 1 capsule by mouth daily.    [provider]  solifenacin (VESICARE) 10 MG tablet Take 1 tablet (10 mg total) by mouth daily. 10/05/20   Sater, Pearletha Furl, MD  valsartan (DIOVAN) 40 MG tablet Take 40 mg by mouth  daily.    [provider]    Allergies Penicillins  No family history on file.  Social History Social History   Tobacco Use   Smoking status: Never   Smokeless tobacco: Never  Substance Use Topics   Alcohol use: Yes    Comment: Daily with dinner - wine   Drug use: Never    Review of Systems  Review of Systems  Constitutional:  Positive for fatigue. Negative for chills and fever.  HENT:  Positive for postnasal drip and sinus pressure. Negative for sore throat.   Respiratory:  Positive for shortness of breath.   Cardiovascular:  Negative for chest pain.  Gastrointestinal:  Negative for abdominal pain.  Genitourinary:  Negative for flank pain.   Musculoskeletal:  Negative for neck pain.  Skin:  Negative for rash and wound.  Allergic/Immunologic: Negative for immunocompromised state.  Neurological:  Negative for weakness and numbness.  Hematological:  Does not bruise/bleed easily.  All other systems reviewed and are negative.   ____________________________________________  PHYSICAL EXAM:      VITAL SIGNS: ED Triage Vitals  Enc Vitals Group     BP 12/07/20 1504 (!) 171/69     Pulse Rate 12/07/20 1504 66     Resp 12/07/20 1504 18     Temp 12/07/20 1504 98 F (36.7 C)     Temp src --      SpO2 12/07/20 1504 96 %     Weight --      Height --      Head Circumference --      Peak Flow --      Pain Score 12/07/20 1503 0     Pain Loc --      Pain Edu? --      Excl. in GC? --      Physical Exam Vitals and nursing note reviewed.  Constitutional:      General: She is not in acute distress.    Appearance: She is well-developed.  HENT:     Head: Normocephalic and atraumatic.  Eyes:     Conjunctiva/sclera: Conjunctivae normal.  Cardiovascular:     Rate and Rhythm: Normal rate and regular rhythm.     Heart sounds: Normal heart sounds.  Pulmonary:     Effort: Pulmonary effort is normal. No respiratory distress.     Breath sounds: No wheezing.  Abdominal:     General: There is no distension.  Musculoskeletal:     Cervical back: Neck supple.  Skin:    General: Skin is warm.     Capillary Refill: Capillary refill takes less than 2 seconds.     Findings: No rash.  Neurological:     Mental Status: She is alert and oriented to person, place, and time.     Motor: No abnormal muscle tone.      ____________________________________________   LABS (all labs ordered are listed, but only abnormal results are displayed)  Labs Reviewed  BASIC METABOLIC PANEL - Abnormal; Notable for the following components:      Result Value   Glucose, Bld 155 (*)    All other components within normal limits  CBC  TROPONIN I (HIGH  SENSITIVITY)    ____________________________________________  EKG: Normal sinus rhythm, ventricular rate 63.  PR 156, QRS 80, QTc 450.  No acute ST elevations or depressions.  Indicated evidence of acute ischemia or infarct. ________________________________________  RADIOLOGY All imaging, including plain films, CT scans, and ultrasounds, independently reviewed by me, and interpretations confirmed via  formal radiology reads.  ED MD interpretation:   Chest x-ray: No active disease  Official radiology report(s): DG Chest 2 View  Result Date: 12/07/2020 CLINICAL DATA:  Shortness of breath for over 1 month. EXAM: CHEST - 2 VIEW COMPARISON:  None. FINDINGS: Cardiac silhouette is normal in size and configuration. Normal mediastinal and hilar contours. Minimal linear scarring or atelectasis at the left anterior lung base. Lungs otherwise clear. No pleural effusion or pneumothorax. Skeletal structures are intact. IMPRESSION: No active cardiopulmonary disease. Electronically Signed   By: Amie Portland M.D.   On: 12/07/2020 15:33    ____________________________________________  PROCEDURES   Procedure(s) performed (including Critical Care):  Procedures  ____________________________________________  INITIAL IMPRESSION / MDM / ASSESSMENT AND PLAN / ED COURSE  As part of my medical decision making, I reviewed the following data within the electronic MEDICAL RECORD NUMBER Nursing notes reviewed and incorporated, Old chart reviewed, Notes from prior ED visits, and [place] Controlled Substance Database       *Antonella Upson was evaluated in Emergency Department on 12/07/2020 for the symptoms described in the history of present illness. She was evaluated in the context of the global COVID-19 pandemic, which necessitated consideration that the patient might be at risk for infection with the SARS-CoV-2 virus that causes COVID-19. Institutional protocols and algorithms that pertain to the evaluation of patients  at risk for COVID-19 are in a state of rapid change based on information released by regulatory bodies including the CDC and federal and state organizations. These policies and algorithms were followed during the patient's care in the ED.  Some ED evaluations and interventions may be delayed as a result of limited staffing during the pandemic.*     Medical Decision Making: 75 year old female here with shortness of breath and nasal dryness with dry mouth.  Suspect sinusitis with likely component of postnasal drainage contributing to subjective shortness of breath.  Her chest x-ray is clear and lungs are clear with normal work of breathing and sats greater than 95% on room air.  Do not spit suspect pneumonia.  EKG is nonischemic, troponin negative, do not suspect referred cardiac source.  CBC and BMP are unremarkable.  Will have her decrease her phenylephrine as a suspect this is contributing to some of her nasal dryness, have her instead start Flonase, humidifier, and treat for possible bacterial component of sinusitis with azithromycin.  She is allergic to penicillins.  Otherwise, no apparent emergent pathology.  Will discharge with outpatient PCP follow-up.  ____________________________________________  FINAL CLINICAL IMPRESSION(S) / ED DIAGNOSES  Final diagnoses:  Shortness of breath  Sinus congestion     MEDICATIONS GIVEN DURING THIS VISIT:  Medications - No data to display   ED Discharge Orders          Ordered    fluticasone (FLONASE) 50 MCG/ACT nasal spray  Daily        12/07/20 1744    azithromycin (ZITHROMAX Z-PAK) 250 MG tablet        12/07/20 1744             Note:  This document was prepared using Dragon voice recognition software and may include unintentional dictation errors.   Shaune Pollack, MD 12/07/20 [phone]

## 2020-12-07 NOTE — ED Triage Notes (Signed)
Pt comes with c/o SOB for over a month. Pt states it got worse today because she started to have panic attacks.   Pt states no CP. Pt states SOB all the time no matter what she is doing.

## 2020-12-07 NOTE — Discharge Instructions (Addendum)
For your congestion:  I'd recommend stopping the phenylephrine.  Start using the [facility] nasal spray two sprays daily I'd recommend buying a home humidifier machine, keeping it near the bed or where you are sitting Apply a thick stripe of petroleum jelly into each nostril nightly Drink at least 8 glasses of water daily Continue the mucinex Take the antibiotic as prescribed

## 2020-12-10 ENCOUNTER — Other Ambulatory Visit
Admission: RE | Admit: 2020-12-10 | Discharge: 2020-12-10 | Disposition: A | Discharge status: Home / Self Care | Payer: Medicare Other | Source: Ambulatory Visit | Attending: Internal Medicine | Admitting: Internal Medicine

## 2020-12-10 DIAGNOSIS — R0602 Shortness of breath: Secondary | ICD-10-CM | POA: Insufficient documentation

## 2020-12-10 LAB — TROPONIN I (HIGH SENSITIVITY): Troponin I (High Sensitivity): 11 ng/L (ref <18)

## 2020-12-10 LAB — D-DIMER, QUANTITATIVE: D-Dimer, Quant: 0.87 ug/mL-FEU — ABNORMAL HIGH (ref 0.00–0.50)

## 2020-12-11 ENCOUNTER — Emergency Department
Admission: EM | Admit: 2020-12-11 | Discharge: 2020-12-11 | Disposition: A | Discharge status: Home / Self Care | Payer: Medicare Other | Attending: Emergency Medicine | Admitting: Emergency Medicine

## 2020-12-11 ENCOUNTER — Other Ambulatory Visit: Payer: Self-pay

## 2020-12-11 DIAGNOSIS — I10 Essential (primary) hypertension: Secondary | ICD-10-CM | POA: Diagnosis not present

## 2020-12-11 DIAGNOSIS — Z96659 Presence of unspecified artificial knee joint: Secondary | ICD-10-CM | POA: Insufficient documentation

## 2020-12-11 DIAGNOSIS — F419 Anxiety disorder, unspecified: Secondary | ICD-10-CM

## 2020-12-11 DIAGNOSIS — Z79899 Other long term (current) drug therapy: Secondary | ICD-10-CM | POA: Diagnosis not present

## 2020-12-11 DIAGNOSIS — G4700 Insomnia, unspecified: Secondary | ICD-10-CM | POA: Insufficient documentation

## 2020-12-11 DIAGNOSIS — R0981 Nasal congestion: Secondary | ICD-10-CM | POA: Diagnosis not present

## 2020-12-11 MED ORDER — HYDROXYZINE PAMOATE 100 MG PO CAPS: 100.0000 mg | ORAL_CAPSULE | Freq: Every day | ORAL | 0 refills | Status: DC

## 2020-12-11 NOTE — ED Triage Notes (Signed)
Pt states that she has "trouble drawing a deep breath" and that creates a panic attack for her. A&O, ambulatory. States seen recently for same and Dr Marcello Moores prescribed her medication- azithromycin and nasal spray. No resp distress noted.

## 2020-12-11 NOTE — ED Notes (Signed)
Pt advised she feels like she is taking too many medications. So she stopped taking her prozac and wellbutrin without speaking to her physician. She has become more and more anxious and having insomnia in the last few days. She didn't understand that stopping those meds would cause all of these issues. She advised nobody has told her about the two medications.

## 2020-12-12 NOTE — ED Provider Notes (Signed)
[facility] Emergency Department Provider Note   ____________________________________________   Event Date/Time   First MD Initiated Contact with Patient 12/11/20 1108     (approximate)  I have reviewed the triage vital signs and the nursing notes.   HISTORY  Chief Complaint Panic Attack    HPI Kathy Nelson is a 75 y.o. female with below stated past medical history presents for her third visit in the last 2 weeks for similar symptoms of nasal congestion, shortness of breath, and anxiety.  Patient states that she has been taking decongestants for her sinus pressure but the do not help all the way according to patient.  Patient states that she feels that she cannot get a good deep breath then and this causes severe anxiety.  Patient has been seen in our emergency department on 12/07/2020 and was given Flonase and azithromycin for possible sinus infection that patient states has minimally helped her symptoms.  Patient also states that she is not adherent with her psychopharmaceutical's and has not seen a psychiatric professional in over a year.  Patient currently denies any vision changes, tinnitus, difficulty speaking, facial droop, sore throat, chest pain, shortness of breath, abdominal pain, nausea/vomiting/diarrhea, dysuria, or weakness/numbness/paresthesias in any extremity          Past Medical History:  Diagnosis Date   MS (multiple sclerosis) (HCC)    Shingles     Patient Active Problem List   Diagnosis Date Noted   Insomnia 08/19/2020   Other fatigue 09/18/2019   Osteoarthritis of knee 01/15/2019   Gait disturbance 01/15/2019   High risk medication use 01/15/2019   Hyperlipidemia 01/15/2019   Neck pain 07/17/2018   Blurry vision, bilateral 04/09/2018   Essential hypertension 08/03/2017   Major depressive disorder 08/03/2017   Stress incontinence, female 08/03/2017   Vitamin D deficiency 08/03/2017   Multiple sclerosis (HCC) 07/07/2015     Past Surgical History:  Procedure Laterality Date   BACK SURGERY     BACK SURGERY     cataract surgery     dental implants  2014   KNEE SURGERY     REPLACEMENT TOTAL KNEE  2006   ROTATOR CUFF REPAIR      Prior to Admission medications   Medication Sig Start Date End Date Taking? Authorizing Provider  hydrOXYzine (VISTARIL) 100 MG capsule Take 1 capsule (100 mg total) by mouth at bedtime. 12/11/20  Yes Merwyn Katos, MD  atorvastatin (LIPITOR) 20 MG tablet Take 1 tablet (20 mg total) by mouth daily. 07/05/20   Sater, Pearletha Furl, MD  azithromycin (ZITHROMAX Z-PAK) 250 MG tablet Take 2 tablets (500 mg) on  Day 1,  followed by 1 tablet (250 mg) once daily on Days 2 through 5. 12/07/20   Shaune Pollack, MD  buPROPion (WELLBUTRIN XL) 300 MG 24 hr tablet Take 300 mg by mouth daily.    [provider]  Cholecalciferol (VITAMIN D3 PO) Take 7,000 Units by mouth daily. Vit D3    [provider]  FLUoxetine (PROZAC) 20 MG tablet Take 20 mg by mouth daily.    [provider]  fluticasone (FLONASE) 50 MCG/ACT nasal spray Place 2 sprays into both nostrils daily. 12/07/20 01/06/21  Shaune Pollack, MD  gabapentin (NEURONTIN) 600 MG tablet Take 1/2-1 tablet by mouth three times daily 02/24/20   Sater, Pearletha Furl, MD  ibuprofen (ADVIL) 200 MG tablet Take 200 mg by mouth every 6 (six) hours as needed.    [provider]  modafinil (  PROVIGIL) 200 MG tablet 1/2 to one po qAM and q noon 09/29/20   Sater, Pearletha Furl, MD  Omega-3 Fatty Acids (FISH OIL) 1000 MG CAPS Take 1 capsule by mouth daily.    [provider]  solifenacin (VESICARE) 10 MG tablet Take 1 tablet (10 mg total) by mouth daily. 10/05/20   Sater, Pearletha Furl, MD  valsartan (DIOVAN) 40 MG tablet Take 40 mg by mouth daily.    [provider]    Allergies Penicillins  History reviewed. No pertinent family history.  Social History Social History   Tobacco Use   Smoking status: Never   Smokeless  tobacco: Never  Substance Use Topics   Alcohol use: Yes    Comment: Daily with dinner - wine   Drug use: Never    Review of Systems Constitutional: No fever/chills Eyes: No visual changes. ENT: No sore throat. Cardiovascular: Denies chest pain. Respiratory: Endorses intermittent shortness of breath. Gastrointestinal: No abdominal pain.  No nausea, no vomiting.  No diarrhea. Genitourinary: Negative for dysuria. Musculoskeletal: Negative for acute arthralgias Skin: Negative for rash. Neurological: Negative for headaches, weakness/numbness/paresthesias in any extremity Psychiatric: Negative for suicidal ideation/homicidal ideation   ____________________________________________   PHYSICAL EXAM:  VITAL SIGNS: ED Triage Vitals [12/11/20 1110]  Enc Vitals Group     BP (!) 183/79     Pulse Rate 79     Resp 18     Temp 97.9 F (36.6 C)     Temp Source Oral     SpO2 97 %     Weight 195 lb (88.5 kg)     Height 5\' 7"  (1.702 m)     Head Circumference      Peak Flow      Pain Score 0     Pain Loc      Pain Edu?      Excl. in GC?    Constitutional: Alert and oriented. Well appearing and in no acute distress. Eyes: Conjunctivae are normal. PERRL. Head: Atraumatic. Nose: No congestion/rhinnorhea. Mouth/Throat: Mucous membranes are moist. Neck: No stridor Cardiovascular: Grossly normal heart sounds.  Good peripheral circulation. Respiratory: Normal respiratory effort.  No retractions. Gastrointestinal: Soft and nontender. No distention. Musculoskeletal: No obvious deformities Neurologic:  Normal speech and language. No gross focal neurologic deficits are appreciated. Skin:  Skin is warm and dry. No rash noted. Psychiatric: Mood and affect are normal. Speech and behavior are normal.  ____________________________________________   LABS (all labs ordered are listed, but only abnormal results are displayed)  Labs Reviewed - No data to  display ____________________________________________  PROCEDURES  Procedure(s) performed (including Critical Care):  .1-3 Lead EKG Interpretation  Date/Time: 12/12/2020 7:19 AM Performed by: 02/12/2021, MD Authorized by: Merwyn Katos, MD     Interpretation: normal     ECG rate:  63   ECG rate assessment: normal     Rhythm: sinus rhythm     Ectopy: none     Conduction: normal     ____________________________________________   INITIAL IMPRESSION / ASSESSMENT AND PLAN / ED COURSE  As part of my medical decision making, I reviewed the following data within the electronic medical record, if available:  Nursing notes reviewed and incorporated, Labs reviewed, EKG interpreted, Old chart reviewed, Radiograph reviewed and Notes from prior ED visits reviewed and incorporated      The patient is suffering from shortness of breath, but the immediate cause is not apparent.  Potential causes considered include, but are not limited to,  asthma or COPD, congestive heart failure, pulmonary embolism, pneumothorax, coronary syndrome, pneumonia, and pleural effusion.  Despite the evaluation including history and exam the cause of the shortness of breath remains unclear.  Patient had a full and complete work-up by previous ER provider as well as her PCP.  However, during the ED stay, patients condition remained stable, and at the time of discharge the shortness of breath had not recurred, they are feeling well, and want to go home.  Patient encouraged to begin taking her psychopharmaceutical's once again as well as follow-up with her psychiatrist  Patient will be discharged with strict return precautions and advice to follow up with primary MD within 24 hours for further evaluation.      ____________________________________________   FINAL CLINICAL IMPRESSION(S) / ED DIAGNOSES  Final diagnoses:  Sinus congestion  Anxiety  Insomnia, unspecified type     ED Discharge Orders           Ordered    hydrOXYzine (VISTARIL) 100 MG capsule  Daily at bedtime        12/11/20 1200             Note:  This document was prepared using Dragon voice recognition software and may include unintentional dictation errors.    Merwyn Katos, MD 12/12/20 [phone]

## 2021-02-21 ENCOUNTER — Ambulatory Visit: Payer: Medicare Other | Admitting: Neurology

## 2021-02-21 ENCOUNTER — Other Ambulatory Visit: Payer: Self-pay

## 2021-02-21 ENCOUNTER — Encounter: Payer: Self-pay | Admitting: Neurology

## 2021-02-21 VITALS — BP 137/69 | HR 64 | Ht 67.0 in | Wt 188.5 lb

## 2021-02-21 DIAGNOSIS — N3281 Overactive bladder: Secondary | ICD-10-CM | POA: Diagnosis not present

## 2021-02-21 DIAGNOSIS — R5383 Other fatigue: Secondary | ICD-10-CM

## 2021-02-21 DIAGNOSIS — G35 Multiple sclerosis: Secondary | ICD-10-CM | POA: Diagnosis not present

## 2021-02-21 DIAGNOSIS — R269 Unspecified abnormalities of gait and mobility: Secondary | ICD-10-CM

## 2021-02-21 DIAGNOSIS — F329 Major depressive disorder, single episode, unspecified: Secondary | ICD-10-CM

## 2021-02-21 NOTE — Progress Notes (Signed)
[facility]  PATIENT: Kathy Nelson DOB: [birth year]  REFERRING DOCTOR OR PCP: PCP is [facility] in [place], referred by Dr. Coral Else FNP SOURCE: Patient, notes from [facility] neurology, imaging reports, MRI images personally reviewed.  _________________________________   HISTORICAL  CHIEF COMPLAINT:  Chief Complaint  Patient presents with   Follow-up    RM 2, alone. Last seen 08/19/20. Ambulates w/ walker.    HISTORY OF PRESENT ILLNESS:  Kathy Nelson is a 75 y.o. woman with multiple sclerosis.  Update 9/17//2022: She stopped Aubagio around 07/2019.  She has no exacerbations off the medication.      She is walking the same with an unsteady gait.  She has had a few falls.  She hurt her right kee with one (cracked patella). She uses a cane most of the time outside her home.  She has occasional falls.   She has dysesthesias in both legs and is on gabapentin (take 1 - 1/2 - 1/2)  with som benefit.  Lamotrigine did not help so she stopped.   She has urinary urgency and leakage but it is better .   Her vision is mostly stable    She saw ophthalmology yesterday and will be getting new glasses.   Mood is doing better.   SHe is on Wellbutrin.    She is sleeping better.   Provigil 200 mg in the Am and 100 mg mid-day has helped fatigue.    She also reports lower back pain.   She has chronic left knee pain but right knee was worse for a while after a fall.   She has been vaccinated for [employer]) in Jan/Fe.  We discussed getting the booster.  MS History: She was diagnosed with MS in 55 (Dr. Dortha Schwalbe in [place]) after presenting with flank dysesthesias. In retrospect she had optic neuritis in 1987.    She was first told she might have cholecystitis.   Then Imaging studies were consistent with MS.   CSF was also consistent with MS.   She was placed on Avonex and remained on it x many years.    She did not have any exacerbations.  She moved to [facility] and started to see Dr. Freda Munro.  When he retired, she started to see Dr. Vernie Ammons.    She has had a more SPMS course since 2015 or so with slow worsening of gait and balance.    IMAGING The MRI of the brain 11/23/2017 shows typical T2/flair hyperintense foci in the periventricular, juxtacortical deep white matter of the hemispheres consistent with MS.  There is cortical atrophy.    MRI of the cervical spine 11/23/2017 shows no definite MS plaques.  Cervical degenerative changes noted at C3-C4, C4-C5, C5-C6 and C6-C7.  There is bilateral foraminal narrowing at C5-C6 and left greater than right foraminal narrowing at C6-C7   MRI of the thoracic spine 11/23/2017 shows several T2 hyperintense foci.   Unfortunately, the CD PACS does not have the ability to assess spinal levels with scout lines.  In this context, there appears to be a focus at T3-T4, T5-T6 and T8-T9 towards the left.  MRI of the brain 10/23/2019 showed no new lesions  REVIEW OF SYSTEMS: Constitutional: No fevers, chills, sweats, or change in appetite.  She has fatigue.   Eyes: No visual changes, double vision, eye pain Ear, nose and throat: No hearing loss, ear pain, nasal congestion, sore throat Cardiovascular: No chest pain, palpitations Respiratory:  No shortness of breath  at rest or with exertion.   No wheezes GastrointestinaI: No nausea, vomiting, diarrhea, abdominal pain, fecal incontinence Genitourinary:  No dysuria, urinary retention or frequency.  No nocturia. Musculoskeletal: She has neck pain, low back pain and left knee pain.  She has a history of lumbar surgery at L4-L5.   Integumentary: No rash, pruritus, skin lesions Neurological: as above Psychiatric: She has had depression. Endocrine: No palpitations, diaphoresis, change in appetite, change in weigh or increased thirst Hematologic/Lymphatic:  No anemia, purpura, petechiae. Allergic/Immunologic: No itchy/runny eyes, nasal congestion, recent allergic reactions,  rashes  ALLERGIES: Allergies  Allergen Reactions   Penicillins Rash    Was told by her mother she was allergic to Penicillin Was told by her mother she was allergic to Penicillin     HOME MEDICATIONS:  Current Outpatient Medications:    atorvastatin (LIPITOR) 20 MG tablet, Take 1 tablet (20 mg total) by mouth daily., Disp: 90 tablet, Rfl: 2   buPROPion (WELLBUTRIN XL) 300 MG 24 hr tablet, Take 300 mg by mouth daily., Disp: , Rfl:    FLUoxetine (PROZAC) 20 MG tablet, Take 20 mg by mouth daily., Disp: , Rfl:    gabapentin (NEURONTIN) 600 MG tablet, Take 1/2-1 tablet by mouth three times daily, Disp: 90 tablet, Rfl: 11   hydrOXYzine (VISTARIL) 100 MG capsule, Take 1 capsule (100 mg total) by mouth at bedtime., Disp: 30 capsule, Rfl: 0   ibuprofen (ADVIL) 200 MG tablet, Take 200 mg by mouth every 6 (six) hours as needed., Disp: , Rfl:    modafinil (PROVIGIL) 200 MG tablet, 1/2 to one po qAM and q noon, Disp: 180 tablet, Rfl: 1   Omega-3 Fatty Acids (FISH OIL) 1000 MG CAPS, Take 1 capsule by mouth daily., Disp: , Rfl:    valsartan (DIOVAN) 40 MG tablet, Take 40 mg by mouth daily., Disp: , Rfl:   PAST MEDICAL HISTORY: Past Medical History:  Diagnosis Date   MS (multiple sclerosis) (HCC)    Shingles     PAST SURGICAL HISTORY: Past Surgical History:  Procedure Laterality Date   BACK SURGERY     BACK SURGERY     cataract surgery     dental implants  2014   KNEE SURGERY     REPLACEMENT TOTAL KNEE  2006   ROTATOR CUFF REPAIR      FAMILY HISTORY: No family history on file.  SOCIAL HISTORY:  Social History   Socioeconomic History   Marital status: Widowed    Spouse name: Not on file   Number of children: Not on file   Years of education: Not on file   Highest education level: Not on file  Occupational History   Not on file  Tobacco Use   Smoking status: Never   Smokeless tobacco: Never  Substance and Sexual Activity   Alcohol use: Yes    Comment: Daily with dinner -  wine   Drug use: Never   Sexual activity: Not on file  Other Topics Concern   Not on file  Social History Narrative   Lives alone   No caffeine use   Right handed    Social Determinants of Health   Financial Resource Strain: Not on file  Food Insecurity: Not on file  Transportation Needs: Not on file  Physical Activity: Not on file  Stress: Not on file  Social Connections: Not on file  Intimate Partner Violence: Not on file     PHYSICAL EXAM  Vitals:   02/21/21 1317  BP: 137/69  Pulse: 64  Weight: 188 lb 8 oz (85.5 kg)  Height: 5\' 7"  (1.702 m)    Body mass index is 29.52 kg/m.   General: The patient is well-developed and well-nourished and in no acute distres  Skin: Extremities are without rash or edema.  Neurologic Exam  Mental status: The patient is alert and oriented x 3 at the time of the examination. The patient has apparent normal recent and remote memory.  Attention was reduced.   Speech is normal.  Cranial nerves: Extraocular movements are full.    Facial symmetry is present.  Facial strength is normal.  Trapezius and sternocleidomastoid strength is normal. No dysarthria is noted.  No obvious hearing deficits are noted.  Motor:  Muscle bulk is normal.   Tone is normal. Strength is  5 / 5 in all 4 extremities except 4+/5 left ankle/toe extension..   Sensory: Sensory testing is intact to pinprick, soft touch and vibration sensation in arms and trunk but reduced in right leg.     Coordination: Finger-nose-finger is normal and heel-to-shin is performed appropriately for strength  Gait and station: Station is normal.   She can walk without her walker inside the room.   Gait is wide but stride was fairly normal and turn was 3 steps. Unable to tandem walk. Romberg is negative.   Reflexes: Deep tendon reflexes are symmetric and normal bilaterally.       ASSESSMENT AND PLAN     1. Multiple sclerosis (HCC)   2. Gait disturbance   3. Other fatigue   4.  Overactive bladder   5. Major depressive disorder, remission status unspecified, unspecified whether recurrent      1.  Remain off Aubagio.  Brain MRI to determine if she has clinical progression.  If clinical progression is occurring we would need to consider restarting a DMT 2.  Stay active and exercise 3.  Continue gabapentin , modafinil (200 in am and 100 in pm.   4.   Can take NSAIDs for knee pain -  Plans to see Ortho. 5.   If sleepiness worsens, consider PSG 6.   Return in 6 months or sooner if there are new or worsening neurologic symptoms.  Revia Nghiem A. Marland Kitchen, MD, PhD, FAAN Certified in Neurology, Clinical Neurophysiology, Sleep Medicine, Pain Medicine and Neuroimaging Director, [facility]  [facility] [address] [place], [place] [phone]

## 2021-03-07 ENCOUNTER — Ambulatory Visit
Admission: RE | Admit: 2021-03-07 | Discharge: 2021-03-07 | Disposition: A | Discharge status: Home / Self Care | Payer: Medicare Other | Source: Ambulatory Visit | Attending: Neurology | Admitting: Neurology

## 2021-03-07 DIAGNOSIS — R269 Unspecified abnormalities of gait and mobility: Secondary | ICD-10-CM

## 2021-03-07 DIAGNOSIS — G35 Multiple sclerosis: Secondary | ICD-10-CM | POA: Diagnosis not present

## 2021-03-07 MED ORDER — GADOBENATE DIMEGLUMINE 529 MG/ML IV SOLN
17.0000 mL | Freq: Once | INTRAVENOUS | Status: AC | PRN
  Administered 2021-03-07: 17 mL via INTRAVENOUS

## 2021-03-15 ENCOUNTER — Other Ambulatory Visit: Payer: Self-pay | Admitting: Neurology

## 2021-03-28 ENCOUNTER — Other Ambulatory Visit: Payer: Self-pay | Admitting: Neurology

## 2021-03-28 DIAGNOSIS — N3281 Overactive bladder: Secondary | ICD-10-CM

## 2021-03-30 ENCOUNTER — Other Ambulatory Visit: Payer: Self-pay | Admitting: Neurology

## 2021-04-04 ENCOUNTER — Other Ambulatory Visit: Payer: Self-pay | Admitting: Neurology

## 2021-04-04 DIAGNOSIS — N3281 Overactive bladder: Secondary | ICD-10-CM

## 2021-05-02 ENCOUNTER — Other Ambulatory Visit: Payer: Self-pay | Admitting: Neurology

## 2021-05-04 ENCOUNTER — Telehealth: Payer: Self-pay | Admitting: Neurology

## 2021-05-04 NOTE — Telephone Encounter (Signed)
Called the pt back. There was no answer. LVM advising the pt to call back or send a mychart message with her concerns

## 2021-05-04 NOTE — Telephone Encounter (Signed)
Attempted to contact the patient back and the phone line was busy. Will try again later

## 2021-05-04 NOTE — Telephone Encounter (Signed)
Pt states her feet are bothering her terribly and her toes hurt very bad, pt is asking for a call to discuss any suggestions.

## 2021-05-05 MED ORDER — GABAPENTIN 600 MG PO TABS: 600.0000 mg | ORAL_TABLET | Freq: Three times a day (TID) | ORAL | 5 refills | Status: DC

## 2021-05-05 NOTE — Telephone Encounter (Signed)
Patient returned call.  Patient states about 2 weeks ago she began to have a major discomfort/sensation in her feet bilaterally.  She states that the best way to describe the feeling is it feels like when you get socks bunched up behind your toes.  She states that that feeling is there constantly.  She states it makes it difficult/bothersome to ambulate.  Patient is working with physical therapy and Occupational Therapy 2 times a week.  The therapist did note some changes with balance around the time that the sensation started.  Patient currently is taking gabapentin 300 mg twice a day and 600 mg at bedtime.  Advised the patient I would discuss with Dr. Epimenio Foot to determine what his thoughts would be and be back in touch with his recommendation.  Confirmed the [facility] on file is correct in case we have to send medication.

## 2021-05-05 NOTE — Addendum Note (Signed)
Addended by: Judi Cong on: 05/05/2021 03:23 PM   Modules accepted: Orders

## 2021-05-05 NOTE — Telephone Encounter (Signed)
Called the pt back. After discussing with Dr Epimenio Foot he states that he would recommend she get a MRI cervical/thoracic  spine. Advised that if she is tolerating the gabapentin and feels that she can increase he is ok with her increasing the gabapentin to 600 mg dose in am, 600 mg in afternoon and the 600 mg at bedtime. Advised I would send this into the pharmacy for the patient to update this. Pt verbalized understanding and was appreciative for the call back

## 2021-05-09 ENCOUNTER — Telehealth: Payer: Self-pay | Admitting: *Deleted

## 2021-05-09 NOTE — Telephone Encounter (Signed)
Request Reference Number: [id number]. MODAFINIL TAB 200MG  is approved through 11/07/2021. Your patient may now fill this prescription and it will be covered.

## 2021-05-09 NOTE — Telephone Encounter (Signed)
Submitted PA modafinil on CMM. Key: [id number]. Waiting on determination from [employer] Part D.

## 2021-05-10 ENCOUNTER — Telehealth: Payer: Self-pay | Admitting: Neurology

## 2021-05-10 DIAGNOSIS — R5383 Other fatigue: Secondary | ICD-10-CM

## 2021-05-10 DIAGNOSIS — R269 Unspecified abnormalities of gait and mobility: Secondary | ICD-10-CM

## 2021-05-10 DIAGNOSIS — G35 Multiple sclerosis: Secondary | ICD-10-CM

## 2021-05-10 NOTE — Telephone Encounter (Signed)
Pt called she thinks the gabapentin (NEURONTIN) 600 MG tablet is keeping her in the bathroom and she is not happy. Pt requesting a call back.

## 2021-05-10 NOTE — Telephone Encounter (Signed)
Called the patient back. She states that she is constantly in the bathroom. She states that she will go to pee and then as soon as she gets out of the bathroom feels like she has to go again. She has no pain or discomfort associated with this. She has been on vesicare for urgency. She questioned if this was r/t to gabapentin because was recently increased. When I asked her about symptoms she states has gradually been getting worse over the last 2 wks. The increase was just made last week. Advised that at this time she should contact her primary care to ensure there is no UTI going on and then if clear, we may need to assess if changes should be made to her medication. Pt verbalized understanding. I will also make sure Dr Epimenio Foot is aware to get his thoughts.

## 2021-05-11 NOTE — Addendum Note (Signed)
Addended by: Arther Abbott on: 05/11/2021 08:42 AM   Modules accepted: Orders

## 2021-05-11 NOTE — Telephone Encounter (Signed)
Called and spoke w/ pt. Relayed Dr. Bonnita Hollow message. She will try and stop vesicare and still get UA via PCP. She will call back to let us know if sx do not improve.  She asked about getting MRI thoracic/cervical scheduled. Orders placed and aware MRI coordinator will reach out to schedule once authorized via insurance.

## 2021-05-12 ENCOUNTER — Telehealth: Payer: Self-pay | Admitting: Neurology

## 2021-05-12 NOTE — Telephone Encounter (Signed)
[employer] medicare order sent to [facility], [facility] they will reach out to the patient to schedule.

## 2021-05-18 ENCOUNTER — Other Ambulatory Visit: Payer: Self-pay | Admitting: Neurology

## 2021-05-18 ENCOUNTER — Ambulatory Visit
Admission: RE | Admit: 2021-05-18 | Discharge: 2021-05-18 | Disposition: A | Discharge status: Home / Self Care | Payer: Medicare Other | Source: Ambulatory Visit | Attending: Neurology | Admitting: Neurology

## 2021-05-18 ENCOUNTER — Other Ambulatory Visit: Payer: Self-pay

## 2021-05-18 DIAGNOSIS — G35 Multiple sclerosis: Secondary | ICD-10-CM

## 2021-05-18 DIAGNOSIS — R269 Unspecified abnormalities of gait and mobility: Secondary | ICD-10-CM

## 2021-05-18 MED ORDER — GADOBENATE DIMEGLUMINE 529 MG/ML IV SOLN
17.0000 mL | Freq: Once | INTRAVENOUS | Status: AC | PRN
  Administered 2021-05-18: 14:00:00 17 mL via INTRAVENOUS

## 2021-05-18 MED ORDER — MODAFINIL 200 MG PO TABS: ORAL_TABLET | ORAL | 0 refills | Status: DC

## 2021-05-18 NOTE — Telephone Encounter (Signed)
Received refill request for modafinil.  Last OV was on 02/21/21.  Next OV is scheduled for 08/24/21.  Last RX was written on 12/13/20 for 180 tabs.   [place] [facility] has been reviewed.

## 2021-05-18 NOTE — Telephone Encounter (Signed)
Pt requesting refill for modafinil (PROVIGIL) 200 MG tablet. Pharmacy [id number] - [place], [place] - [address]

## 2021-06-08 ENCOUNTER — Encounter: Payer: Self-pay | Admitting: Neurology

## 2021-06-08 ENCOUNTER — Other Ambulatory Visit: Payer: Self-pay

## 2021-06-08 ENCOUNTER — Ambulatory Visit: Payer: Medicare Other | Admitting: Neurology

## 2021-06-08 VITALS — BP 143/75 | HR 62 | Ht 67.0 in | Wt 196.5 lb

## 2021-06-08 DIAGNOSIS — F329 Major depressive disorder, single episode, unspecified: Secondary | ICD-10-CM

## 2021-06-08 DIAGNOSIS — G35 Multiple sclerosis: Secondary | ICD-10-CM | POA: Diagnosis not present

## 2021-06-08 DIAGNOSIS — N3281 Overactive bladder: Secondary | ICD-10-CM | POA: Diagnosis not present

## 2021-06-08 DIAGNOSIS — R269 Unspecified abnormalities of gait and mobility: Secondary | ICD-10-CM | POA: Diagnosis not present

## 2021-06-08 MED ORDER — OXYBUTYNIN CHLORIDE ER 5 MG PO TB24: 5.0000 mg | ORAL_TABLET | Freq: Every day | ORAL | 5 refills | Status: DC

## 2021-06-08 NOTE — Progress Notes (Signed)
[facility]  PATIENT: Kathy Nelson DOB: 1945/12/30  REFERRING DOCTOR OR PCP: PCP is [facility] in [place], referred by Dr. Coral Else FNP SOURCE: Patient, notes from [facility] neurology, imaging reports, MRI images personally reviewed.  _________________________________   HISTORICAL  CHIEF COMPLAINT:  Chief Complaint  Patient presents with   Follow-up    RM 1, alone. Last seen 0919/22. Here to discuss MRI results.     HISTORY OF PRESENT ILLNESS:  Kathy Nelson is a 76 y.o. woman with multiple sclerosis.  Update  06/08/2021 She stopped Aubagio around 07/2019.  She has no exacerbations off the medication.      She is noting more urinary urgency and occasional urge incontinence.   She had been on a bladder medications years ago - not sure if it helped or not.     Her gait is unsteady with one fall in December.   She had a knee fracture with a fall in the past.   She uses a cane most of the time outside her home.  She has occasional falls.  She was stuck in an elevator > 1 hour and needed to si down as she could not stand up any longer.  She needed help to stand.    She has dysesthesias in both legs and is on gabapentin (take 1 - 1/2 - 1/2)  with som benefit.  Lamotrigine did not help so she stopped.     Her vision is mostly stable    She saw ophthalmology yesterday and will be getting new glasses.   Mood is doing better. She gets upset easily.    She is on Wellbutrin.    She is sleeping better.   Provigil 200 mg in the Am and 100 mg mid-day has helped fatigue.    She also reports lower back pain.  Pain is worse in the back and into the legs if she stands for a while.  Her legs also feel weaker when she stands a while.Marland Kitchen  MRI in 2013 showed moderate spinal stenosis and anterolisthesis at L4L5    She had  fusion surgery ([facility] L4L5 [facility]) .   Xray in 2020 showed anterolisthesis at L4L5 and L5S1.  Pain is worse if she is on her  feet a longer time  MS History: She was diagnosed with MS in 1991 (Dr. Dortha Schwalbe in [place]) after presenting with flank dysesthesias. In retrospect she had optic neuritis in 1987.    She was first told she might have cholecystitis.   Then Imaging studies were consistent with MS.   CSF was also consistent with MS.   She was placed on Avonex and remained on it x many years.    She did not have any exacerbations.  She moved to [facility] and started to see Dr. Freda Munro.  When he retired, she started to see Dr. Vernie Ammons.    She has had a more SPMS course since 2015 or so with slow worsening of gait and balance.    IMAGING  The MRI of the brain 11/23/2017 shows typical T2/flair hyperintense foci in the periventricular, juxtacortical deep white matter of the hemispheres consistent with MS.  There is cortical atrophy.    MRI of the cervical spine 11/23/2017 shows no definite MS plaques.  Cervical degenerative changes noted at C3-C4, C4-C5, C5-C6 and C6-C7.  There is bilateral foraminal narrowing at C5-C6 and left greater than right foraminal narrowing at C6-C7   MRI of the thoracic  spine 11/23/2017 shows several T2 hyperintense foci.   Unfortunately, the CD PACS does not have the ability to assess spinal levels with scout lines.  In this context, there appears to be a focus at T3-T4, T5-T6 and T8-T9 towards the left.  MRI of the brain 10/23/2019 showed no new lesions  MRI of the brain 03/07/2021 showed Multiple T2/FLAIR hyperintense foci in the hemispheres.  Many of the foci are in the deep white matter and some foci are in the periventricular and juxtacortical white matter.  The pattern is most consistent with a combination of demyelination and chronic microvascular ischemic change.  None of the foci appear to be acute.  They do not enhance.  Compared to the MRI dated 10/22/2019, there were no new lesions..   Moderate generalized cortical atrophy, stable compared to the 2021 MRI.  MRI of the cervical and  thoracic spine 05/18/2021 showed a normal cervical spinal cord.  There were T2 hyperintense foci in the thoracic spinal cord anteriorly to the left at T5-T6, centrally adjacent to T6, centrally adjacent to T7.  The foci did not enhance   REVIEW OF SYSTEMS: Constitutional: No fevers, chills, sweats, or change in appetite.  She has fatigue.   Eyes: No visual changes, double vision, eye pain Ear, nose and throat: No hearing loss, ear pain, nasal congestion, sore throat Cardiovascular: No chest pain, palpitations Respiratory:  No shortness of breath at rest or with exertion.   No wheezes GastrointestinaI: No nausea, vomiting, diarrhea, abdominal pain, fecal incontinence Genitourinary:  No dysuria, urinary retention or frequency.  No nocturia. Musculoskeletal: She has neck pain, low back pain and left knee pain.  She has a history of lumbar surgery at L4-L5.   Integumentary: No rash, pruritus, skin lesions Neurological: as above Psychiatric: She has had depression. Endocrine: No palpitations, diaphoresis, change in appetite, change in weigh or increased thirst Hematologic/Lymphatic:  No anemia, purpura, petechiae. Allergic/Immunologic: No itchy/runny eyes, nasal congestion, recent allergic reactions, rashes  ALLERGIES: Allergies  Allergen Reactions   Penicillins Rash    Was told by her mother she was allergic to Penicillin Was told by her mother she was allergic to Penicillin     HOME MEDICATIONS:  Current Outpatient Medications:    atorvastatin (LIPITOR) 20 MG tablet, TAKE 1 TABLET(20 MG) BY MOUTH DAILY, Disp: 90 tablet, Rfl: 1   buPROPion (WELLBUTRIN XL) 300 MG 24 hr tablet, Take 300 mg by mouth daily., Disp: , Rfl:    celecoxib (CELEBREX) 200 MG capsule, Take 200 mg by mouth daily., Disp: , Rfl:    FLUoxetine (PROZAC) 20 MG tablet, Take 20 mg by mouth daily., Disp: , Rfl:    gabapentin (NEURONTIN) 600 MG tablet, Take 1 tablet (600 mg total) by mouth 3 (three) times daily., Disp: 90  tablet, Rfl: 5   hydrOXYzine (VISTARIL) 100 MG capsule, Take 1 capsule (100 mg total) by mouth at bedtime., Disp: 30 capsule, Rfl: 0   ibuprofen (ADVIL) 200 MG tablet, Take 200 mg by mouth every 6 (six) hours as needed., Disp: , Rfl:    modafinil (PROVIGIL) 200 MG tablet, TAKE 1/2 TO 1 TABLET BY MOUTH EVERY MORNING AND AT NOON, Disp: 180 tablet, Rfl: 0   Omega-3 Fatty Acids (FISH OIL) 1000 MG CAPS, Take 1 capsule by mouth daily., Disp: , Rfl:    oxybutynin (DITROPAN-XL) 5 MG 24 hr tablet, Take 1 tablet (5 mg total) by mouth at bedtime., Disp: 30 tablet, Rfl: 5   POTASSIUM PO, Take 1 Dose by  mouth daily., Disp: , Rfl:    torsemide (DEMADEX) 10 MG tablet, Take 10 mg by mouth daily., Disp: , Rfl:    valsartan (DIOVAN) 40 MG tablet, Take 40 mg by mouth daily., Disp: , Rfl:   PAST MEDICAL HISTORY: Past Medical History:  Diagnosis Date   MS (multiple sclerosis) (HCC)    Shingles     PAST SURGICAL HISTORY: Past Surgical History:  Procedure Laterality Date   BACK SURGERY     BACK SURGERY     cataract surgery     dental implants  2014   KNEE SURGERY     REPLACEMENT TOTAL KNEE  2006   ROTATOR CUFF REPAIR      FAMILY HISTORY: No family history on file.  SOCIAL HISTORY:  Social History   Socioeconomic History   Marital status: Widowed    Spouse name: Not on file   Number of children: Not on file   Years of education: Not on file   Highest education level: Not on file  Occupational History   Not on file  Tobacco Use   Smoking status: Never   Smokeless tobacco: Never  Substance and Sexual Activity   Alcohol use: Yes    Comment: Daily with dinner - wine   Drug use: Never   Sexual activity: Not on file  Other Topics Concern   Not on file  Social History Narrative   Lives alone   No caffeine use   Right handed    Social Determinants of Health   Financial Resource Strain: Not on file  Food Insecurity: Not on file  Transportation Needs: Not on file  Physical Activity: Not  on file  Stress: Not on file  Social Connections: Not on file  Intimate Partner Violence: Not on file     PHYSICAL EXAM  Vitals:   06/08/21 1407  BP: (!) 143/75  Pulse: 62  Weight: 196 lb 8 oz (89.1 kg)  Height:  (1.702 m)     Body mass index is 30.78 kg/m.   General: The patient is well-developed and well-nourished and in no acute distres  Skin: Extremities are without rash or edema.  Neurologic Exam  Mental status: The patient is alert and oriented x 3 at the time of the examination. The patient has apparent normal recent and remote memory.  Attention was reduced.Marland Kitchen   Speech is normal.  Cranial nerves: Extraocular movements are full.    Facial symmetry is present.  Facial strength is normal.  Trapezius and sternocleidomastoid strength is normal. No dysarthria is noted.  No obvious hearing deficits are noted.  Motor:  Muscle bulk is normal.   Tone is normal. Strength is  5 / 5 in all 4 extremities except 4+/5 left ankle/toe extension..   Sensory: Sensory testing is intact to pinprick, soft touch and vibration sensation in arms and trunk but reduced in right leg.     Coordination: Finger-nose-finger is normal and heel-to-shin is performed appropriately for strength  Gait and station: Station is normal.   She can walk without her walker inside the room.   Gait is wide but stride was fairly normal and turn was 3 steps. Unable to tandem walk. Romberg is negative.   Reflexes: Deep tendon reflexes are symmetric and normal bilaterally.       ASSESSMENT AND PLAN     1. Multiple sclerosis (HCC)   2. Overactive bladder   3. Gait disturbance   4. Major depressive disorder, remission status unspecified, unspecified whether recurrent  1.  No evidence of new MS activity.   She has MS lesions in brain and thoracic spine.    2.  Stay active and exercise 3.  Continue gabapentin , modafinil (200 in am and 100 in pm) and celecoxib 4.   UA  UCx and low dose Oxybutynin XL.      Stop the Oxybutynin if any cognitive changes.    5.   She could have lumbar spinal stenosis.  If leg weakness or back pain worsens consider lumbar spine imaging.   Return in 6 months or sooner if there are new or worsening neurologic symptoms.  41-minute office visit with the majority of the time spent face-to-face for history and physical, discussion/counseling and decision-making.  Additional time with record review and documentation.  Dae Antonucci A. Epimenio Foot, MD, PhD, FAAN Certified in Neurology, Clinical Neurophysiology, Sleep Medicine, Pain Medicine and Neuroimaging Director, Multiple Sclerosis Center at [facility]  [facility] [address], [address] [place], [place] [address] [phone]

## 2021-06-10 LAB — URINALYSIS, ROUTINE W REFLEX MICROSCOPIC
Bilirubin, UA: NEGATIVE
Glucose, UA: NEGATIVE
Ketones, UA: NEGATIVE
Nitrite, UA: NEGATIVE
Protein,UA: NEGATIVE
RBC, UA: NEGATIVE
Specific Gravity, UA: 1.021 (ref 1.005–1.030)
Urobilinogen, Ur: 0.2 mg/dL (ref 0.2–1.0)
pH, UA: 6.5 (ref 5.0–7.5)

## 2021-06-10 LAB — MICROSCOPIC EXAMINATION
Bacteria, UA: NONE SEEN
Casts: NONE SEEN /lpf

## 2021-06-10 LAB — URINE CULTURE

## 2021-06-14 ENCOUNTER — Telehealth: Payer: Self-pay | Admitting: Neurology

## 2021-06-14 NOTE — Telephone Encounter (Signed)
Pt requesting refill for modafinil (PROVIGIL) 200 MG tablet. Pharmacy [id number] - [place], [place] - [address]

## 2021-06-14 NOTE — Telephone Encounter (Signed)
Called the pt. According to our records looks like the prescription was sent in for a 3 mth supply to [facility] in December. Advised I can see it doesn't look like that was filled but they should have it on file. Last filled date per [place] drug registry was 12/13/2020.  Advised pt to contact [facility] pharmacy and if they don't have it they can contact our office for refill.  Pt verbalized understanding.

## 2021-08-24 ENCOUNTER — Ambulatory Visit: Payer: Medicare Other | Admitting: Neurology

## 2021-11-10 ENCOUNTER — Telehealth: Payer: Self-pay | Admitting: Neurology

## 2021-11-10 NOTE — Telephone Encounter (Signed)
Pt said, numbness under toes and on top of feet. Can I take more of the Gabapentin or something else. Would like a call back.
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
## 2021-12-05 ENCOUNTER — Other Ambulatory Visit: Payer: Self-pay | Admitting: Neurology

## 2021-12-05 NOTE — Telephone Encounter (Signed)
Rx refilled.

## 2021-12-15 ENCOUNTER — Ambulatory Visit: Payer: Medicare Other | Admitting: Neurology

## 2021-12-15 ENCOUNTER — Encounter: Payer: Self-pay | Admitting: Neurology

## 2021-12-15 VITALS — BP 192/91 | HR 72 | Ht 66.0 in | Wt 195.0 lb

## 2021-12-15 DIAGNOSIS — G35 Multiple sclerosis: Secondary | ICD-10-CM

## 2021-12-15 DIAGNOSIS — R5383 Other fatigue: Secondary | ICD-10-CM

## 2021-12-15 DIAGNOSIS — N3281 Overactive bladder: Secondary | ICD-10-CM

## 2021-12-15 DIAGNOSIS — N393 Stress incontinence (female) (male): Secondary | ICD-10-CM

## 2021-12-15 DIAGNOSIS — F329 Major depressive disorder, single episode, unspecified: Secondary | ICD-10-CM | POA: Diagnosis not present

## 2021-12-15 DIAGNOSIS — R269 Unspecified abnormalities of gait and mobility: Secondary | ICD-10-CM | POA: Diagnosis not present

## 2021-12-15 MED ORDER — ACETAMINOPHEN 325 MG PO CAPS: ORAL_CAPSULE | ORAL | 11 refills | Status: AC

## 2021-12-15 MED ORDER — MODAFINIL 200 MG PO TABS
ORAL_TABLET | ORAL | 1 refills | Status: DC
Start: 2021-12-15 — End: 2022-09-07

## 2021-12-15 MED ORDER — GABAPENTIN 600 MG PO TABS
ORAL_TABLET | ORAL | 5 refills | Status: DC
Start: 2021-12-15 — End: 2023-01-04
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
## 2021-12-24 IMAGING — CR DG CHEST 2V
2 series · 2 of 2 positions shown · non-contrast
Comparison: None.

CLINICAL DATA: Shortness of breath for over 1 month.

EXAM:
CHEST - 2 VIEW

[chest pa]
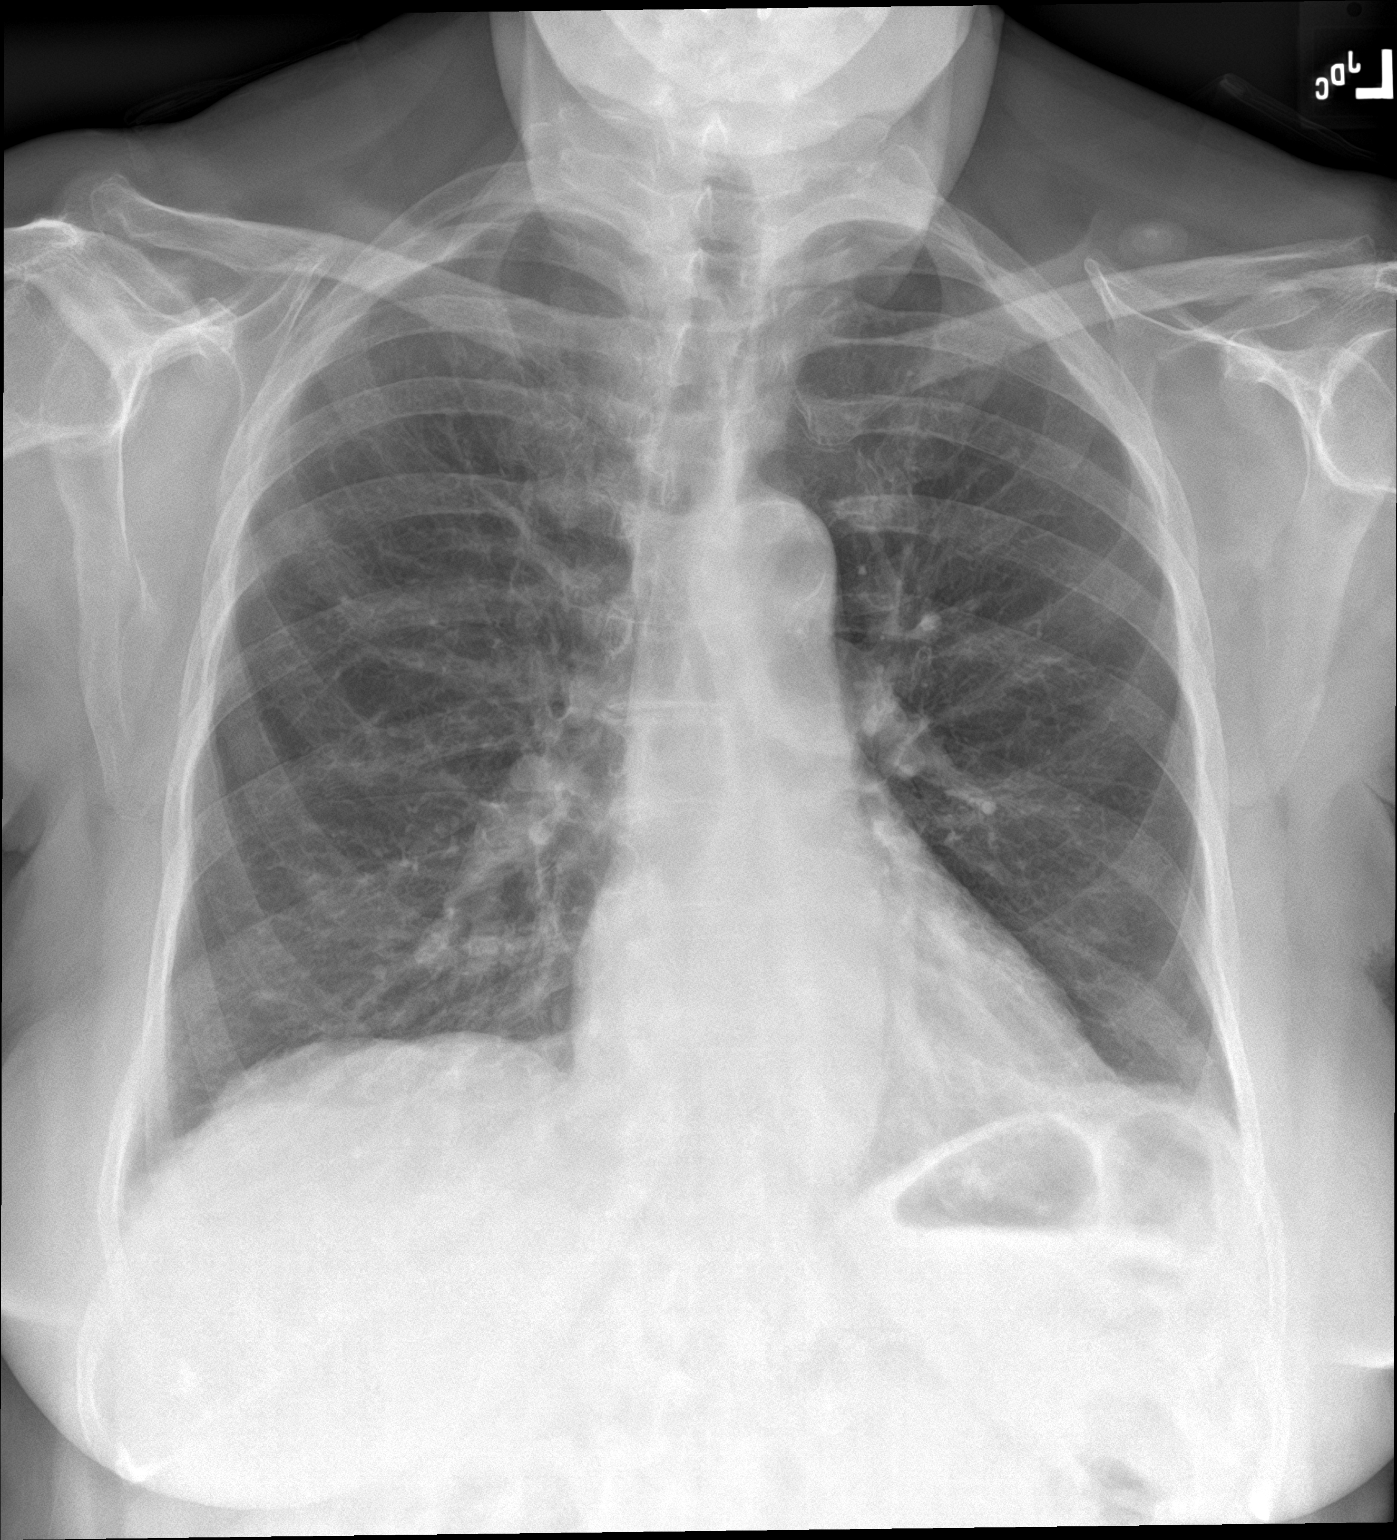

[chest lat]
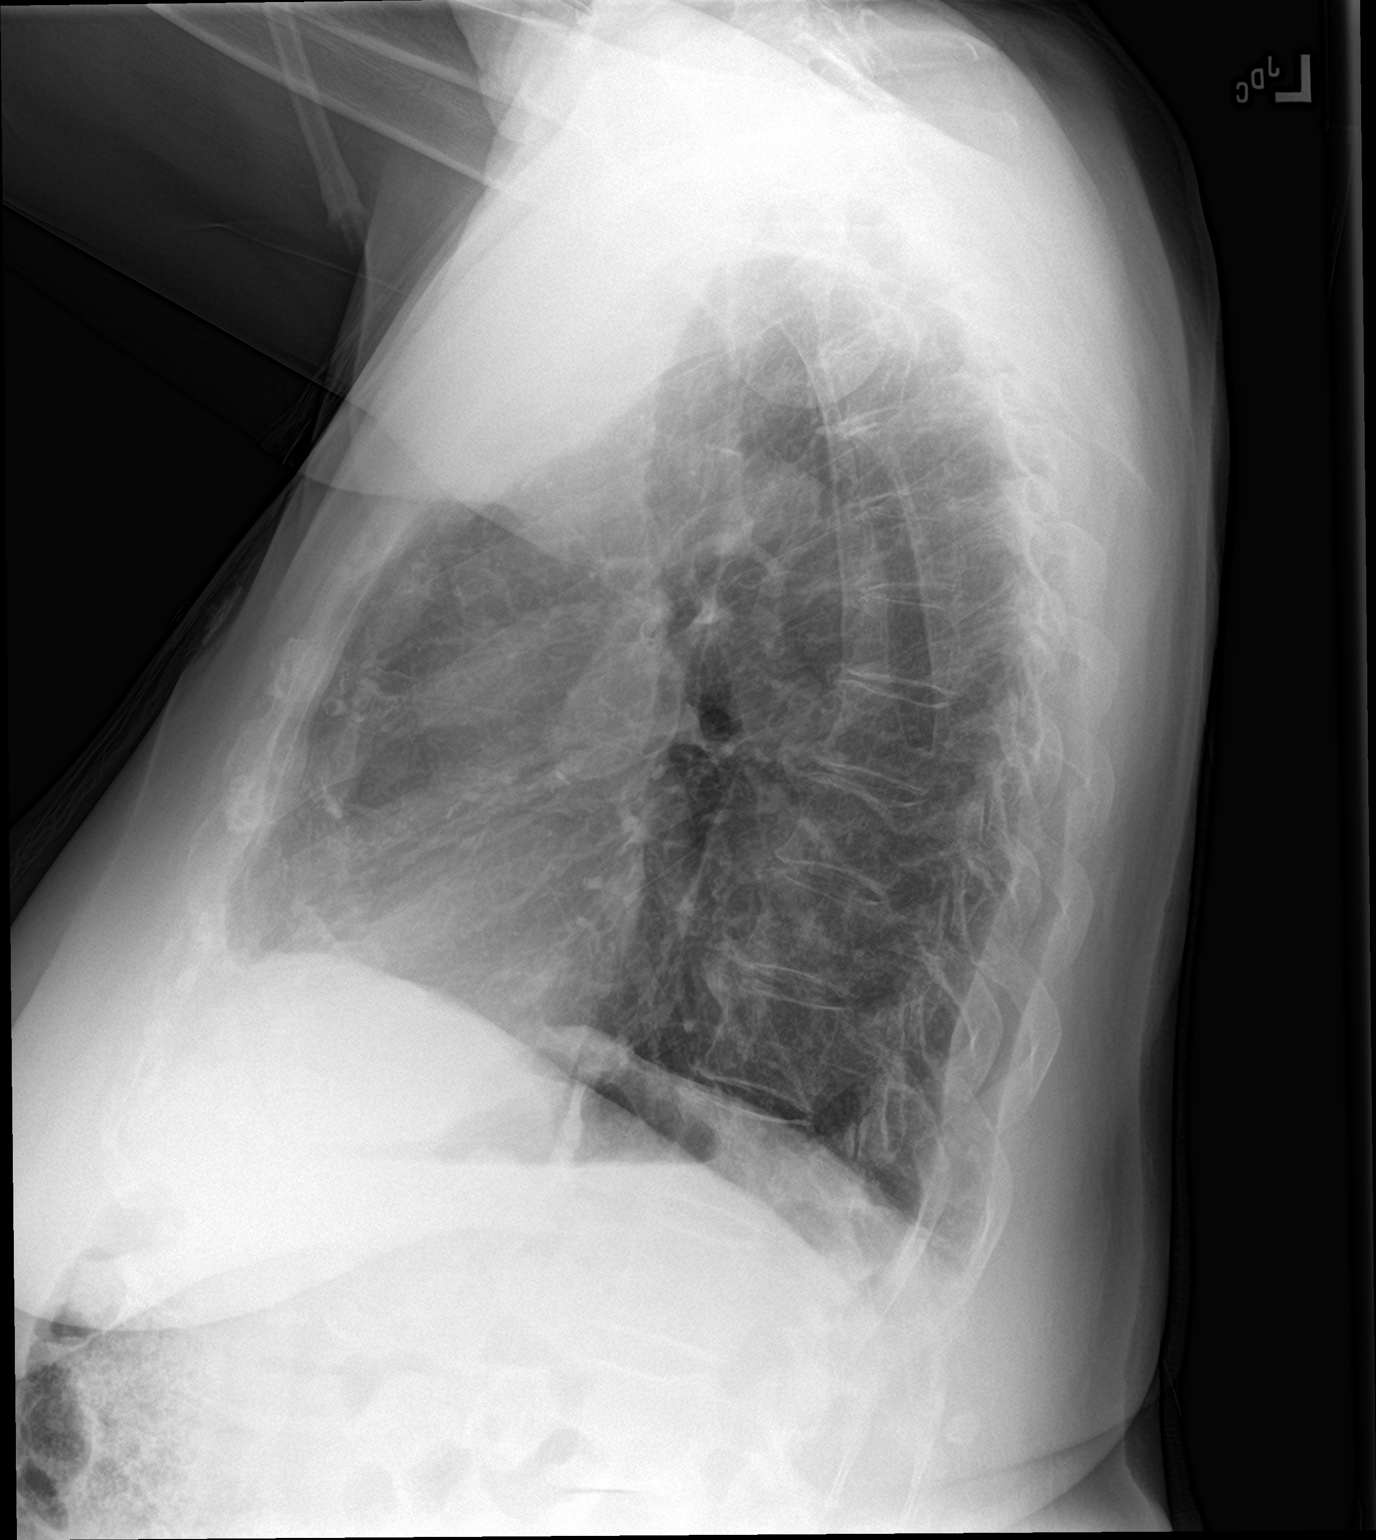

[2 of 2 positions shown; findings below may reference images not displayed]

FINDINGS: Cardiac silhouette is normal in size and configuration. Normal
mediastinal and hilar contours.

Minimal linear scarring or atelectasis at the left anterior lung
base. Lungs otherwise clear.

No pleural effusion or pneumothorax.

Skeletal structures are intact.
IMPRESSION: No active cardiopulmonary disease.

## 2022-02-14 ENCOUNTER — Telehealth: Payer: Self-pay

## 2022-02-14 NOTE — Telephone Encounter (Signed)
PA for modafinil completed via CMM and sent to [facility]. Should have a determination within 3-5 business days. Key: [id number].

## 2022-02-14 NOTE — Telephone Encounter (Signed)
PA for modafinil was approved by [facility]. "Request Reference Number: [id number]. MODAFINIL TAB 200MG  is approved through 08/15/2022. Your patient may now fill this prescription and it will be covered."

## 2022-02-27 ENCOUNTER — Other Ambulatory Visit: Payer: Self-pay | Admitting: Neurology

## 2022-03-07 ENCOUNTER — Telehealth: Payer: Self-pay | Admitting: Neurology

## 2022-03-07 NOTE — Telephone Encounter (Signed)
Reviewed pt chart. Rx last sent to this pharmacy on 12/15/21 #180 with 1 refill. This was a 6 month supply. Too soon to send in refill.

## 2022-03-07 NOTE — Telephone Encounter (Signed)
Pt is calling and requesting a refill on modafinil (PROVIGIL) 200 MG table.  Requesting refill be sent to [facility] #[id number] .

## 2022-04-20 ENCOUNTER — Telehealth: Payer: Self-pay | Admitting: Neurology

## 2022-04-20 NOTE — Telephone Encounter (Signed)
Pt is asking for a call from RN to discuss pain in her legs and feet, pt declined being scheduled on available dates for NP's and Dr Epimenio Foot

## 2022-04-20 NOTE — Telephone Encounter (Signed)
I called the patient back.  She was last seen in July by Dr. Epimenio Foot.  She states her feet hurt up to her ankle in addition to the numbness in her feet.  She states she can also feel it from her hips.  She does see an orthopedic doctor and I have encouraged her to follow-up with them.  She states the pain is worse she also states her fatigue level is "insane" and she will feel like someone "hit her with a stick".  It will hit her at the worst times and she has to "lie down now".  Usually a half an hour later she is feeling better.  She also states that about a week or so ago she noticed that her eyes will intermittently go blurry out of the blue even when she is not using a computer.  She does wear eyeglasses and she does see an eye doctor.  I have encouraged her to follow-up with them.  She last saw her eye doctor 2 months ago.  I was able to schedule her for a visit with Dr Epimenio Foot on Monday, 05/07/22 at 1:00 PM arrival 12:30 PM. She was very appreciative.  This call will be sent to the physician and if he has any further recommendations in the meantime, I will give the patient a call back.

## 2022-05-08 ENCOUNTER — Ambulatory Visit: Payer: Medicare Other | Admitting: Neurology

## 2022-05-08 ENCOUNTER — Encounter: Payer: Self-pay | Admitting: Neurology

## 2022-05-08 ENCOUNTER — Telehealth: Payer: Self-pay | Admitting: Neurology

## 2022-05-08 VITALS — BP 161/74 | HR 62 | Ht 66.0 in | Wt 197.0 lb

## 2022-05-08 DIAGNOSIS — M545 Low back pain, unspecified: Secondary | ICD-10-CM

## 2022-05-08 DIAGNOSIS — G35 Multiple sclerosis: Secondary | ICD-10-CM

## 2022-05-08 DIAGNOSIS — F329 Major depressive disorder, single episode, unspecified: Secondary | ICD-10-CM | POA: Diagnosis not present

## 2022-05-08 DIAGNOSIS — R269 Unspecified abnormalities of gait and mobility: Secondary | ICD-10-CM

## 2022-05-08 DIAGNOSIS — R5383 Other fatigue: Secondary | ICD-10-CM

## 2022-05-08 DIAGNOSIS — G8929 Other chronic pain: Secondary | ICD-10-CM

## 2022-05-08 DIAGNOSIS — N3281 Overactive bladder: Secondary | ICD-10-CM
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
## 2022-05-08 NOTE — Telephone Encounter (Signed)
MRI brain w/wo contrast sent to GI for scheduling, [employer] medicare: NPR.

## 2022-06-06 ENCOUNTER — Ambulatory Visit
Admission: RE | Admit: 2022-06-06 | Discharge: 2022-06-06 | Disposition: A | Discharge status: Home / Self Care | Payer: Medicare Other | Source: Ambulatory Visit | Attending: Neurology | Admitting: Neurology

## 2022-06-06 DIAGNOSIS — G35 Multiple sclerosis: Secondary | ICD-10-CM

## 2022-06-06 DIAGNOSIS — R269 Unspecified abnormalities of gait and mobility: Secondary | ICD-10-CM

## 2022-06-06 MED ORDER — GADOBENATE DIMEGLUMINE 529 MG/ML IV SOLN
15.0000 mL | Freq: Once | INTRAVENOUS | Status: AC | PRN
  Administered 2022-06-06: 15 mL via INTRAVENOUS

## 2022-09-04 ENCOUNTER — Other Ambulatory Visit: Payer: Self-pay | Admitting: Neurology

## 2022-09-04 NOTE — Telephone Encounter (Signed)
Last filled 05/08/22 per note " Will stop oxybutynin as no benefit.  "

## 2022-09-06 ENCOUNTER — Other Ambulatory Visit: Payer: Self-pay | Admitting: Neurology

## 2022-09-07 NOTE — Telephone Encounter (Signed)
Last seen on 05/08/22 Follow up scheduled on 09/19/22 Modafinial 200 mg tablet last filled on 08/12/22 #30 tablets (10 day supply) by PCP Dr. Frazier Richards Rx pending to be signed

## 2022-09-19 ENCOUNTER — Ambulatory Visit (INDEPENDENT_AMBULATORY_CARE_PROVIDER_SITE_OTHER): Payer: Medicare Other | Admitting: Neurology

## 2022-09-19 ENCOUNTER — Encounter: Payer: Self-pay | Admitting: Neurology

## 2022-09-19 VITALS — BP 164/64 | HR 76 | Ht 66.0 in | Wt 185.8 lb

## 2022-09-19 DIAGNOSIS — R269 Unspecified abnormalities of gait and mobility: Secondary | ICD-10-CM | POA: Diagnosis not present

## 2022-09-19 DIAGNOSIS — G4719 Other hypersomnia: Secondary | ICD-10-CM | POA: Diagnosis not present

## 2022-09-19 DIAGNOSIS — G35 Multiple sclerosis: Secondary | ICD-10-CM

## 2022-09-19 DIAGNOSIS — R0683 Snoring: Secondary | ICD-10-CM

## 2022-09-19 DIAGNOSIS — R5383 Other fatigue: Secondary | ICD-10-CM

## 2022-09-19 DIAGNOSIS — N3281 Overactive bladder: Secondary | ICD-10-CM

## 2022-09-19 MED ORDER — MELOXICAM 7.5 MG PO TABS: 7.5000 mg | ORAL_TABLET | Freq: Every day | ORAL | 2 refills | Status: AC

## 2022-09-19 NOTE — Progress Notes (Signed)
[facility]  PATIENT: Kathy Nelson DOB: 25-Jun-1945  REFERRING DOCTOR OR PCP: PCP is [facility] in [place], referred by Dr. Coral Else FNP SOURCE: Patient, notes from [facility] neurology, imaging reports, MRI images personally reviewed.  _________________________________   HISTORICAL  CHIEF COMPLAINT:  Chief Complaint  Patient presents with   Follow-up    Pt in room #10 and alone.  Pt here today to f/u with her MS.    HISTORY OF PRESENT ILLNESS:  Kathy Nelson is a 77 y.o. woman with multiple sclerosis.  Update  09/19/2022 She stopped Aubagio around 07/2019.  She has no exacerbations off the medication or new symptom    She had MRI of the brain 06/06/2022 showing no new lesions compared to 2021.  Additionally she had MRI of the cervical and thoracic spine in December 2022 and there were no acute lesions  She had left TKR and has had medication changes and therapy.    Since then, she is noting gait is not doing as well.    Her left ankle gives out more since then and has pain.  Sometimes she rolls the ankle.   She has more pain in the upper leg as well since the surgery.    She takes Tylenol for pain and takes 500 mg po tid now but was taking q4 hours for a while.     Her gait is unsteady but no recent falls though she has stumbles..   She uses a cane most of the time outside her home.  She uses her walker some since adding a tray.    She has fairly symmetric numbness and dysesthesias in her  legs and is on gabapentin (take 1 - 1/2 - 1/2) .  She is not sure how much it helps as she still has discomfort.   Lamotrigine did not help so she stopped.   Her vision is mostly stable     She is noting more urinary urgency and occasional urge incontinence.   She had been on a bladder medications but not sure if helpe  She has more fatigue and sleepiness.   She snores and had a sleep study many years ago showing borderlines OSA.  She takes Provigil 200 mg in  the am and 100 mg mid-day with mild benefit helped fatigue.  She gets depression and she gets upset easily.  Se notes she panics more at times.  Friends have told her she is not as sharp as she used to be cognitively.    EPWORTH SLEEPINESS SCALE  On a scale of 0 - 3 what is the chance of dozing:  Sitting and Reading:   3 Watching TV:    2 Sitting inactive in a public place: 1 Passenger in car for one hour: 0 Lying down to rest in the afternoon: 3 Sitting and talking to someone: 0 Sitting quietly after lunch:  2 In a car, stopped in traffic:  0  Total (out of 24):   11/24   mild EDS  She also reports lower back pain.  Pain is worse in the back and into the legs if she stands for a while.  Her legs also feel weaker when she stands a while.Marland Kitchen  MRI in 2013 showed moderate spinal stenosis and anterolisthesis at L4L5    She had  fusion surgery ([facility] L4L5 [facility]) .   Xray in 2020 showed anterolisthesis at L4L5 and L5S1.  Pain is worse if she is on her  feet a longer time    MS History: She was diagnosed with MS in 1991 (Dr. Dortha Schwalbe in [place]) after presenting with flank dysesthesias. In retrospect she had optic neuritis in 1987.    She was first told she might have cholecystitis.   Then Imaging studies were consistent with MS.   CSF was also consistent with MS.   She was placed on Avonex and remained on it x many years.    She did not have any exacerbations.  She moved to [facility] and started to see Dr. Freda Munro.  When he retired, she started to see Dr. Vernie Ammons.    She has had a more SPMS course since 2015 or so with slow worsening of gait and balance.  She started Aubagio in 2018 and stopped Aubagio in 2022  IMAGING The MRI of the brain 11/23/2017 shows typical T2/flair hyperintense foci in the periventricular, juxtacortical deep white matter of the hemispheres consistent with MS.  There is cortical atrophy.    MRI of the cervical spine 11/23/2017 shows no definite MS  plaques.  Cervical degenerative changes noted at C3-C4, C4-C5, C5-C6 and C6-C7.  There is bilateral foraminal narrowing at C5-C6 and left greater than right foraminal narrowing at C6-C7   MRI of the thoracic spine 11/23/2017 shows several T2 hyperintense foci.   Unfortunately, the CD PACS does not have the ability to assess spinal levels with scout lines.  In this context, there appears to be a focus at T3-T4, T5-T6 and T8-T9 towards the left.  MRI of the brain 10/23/2019 showed no new lesions  MRI of the brain 03/07/2021 showed Multiple T2/FLAIR hyperintense foci in the hemispheres.  Many of the foci are in the deep white matter and some foci are in the periventricular and juxtacortical white matter.  The pattern is most consistent with a combination of demyelination and chronic microvascular ischemic change.  None of the foci appear to be acute.  They do not enhance.  Compared to the MRI dated 10/22/2019, there were no new lesions..   Moderate generalized cortical atrophy, stable compared to the 2021 MRI.  MRI of the cervical and thoracic spine 05/18/2021 showed a normal cervical spinal cord.  There were T2 hyperintense foci in the thoracic spinal cord anteriorly to the left at T5-T6, centrally adjacent to T6, centrally adjacent to T7.  The foci did not enhance  MRI brain 06/06/2022 shows Multiple T2/FLAIR hyperintense foci in the cerebral hemispheres. Some foci have an appearance most consistent with chronic demyelination as would be seen with multiple sclerosis while others have an appearance more consistent with chronic microvascular ischemic change. 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 The foci appear unchanged compared to the 03/07/2021 MRI.    REVIEW OF SYSTEMS: Constitutional: No fevers, chills, sweats, or change in appetite.  She has fatigue.   Eyes: No visual changes, double vision, eye pain Ear, nose and throat: No hearing loss, ear pain, nasal congestion, sore throat Cardiovascular:  No chest pain, palpitations Respiratory:  No shortness of breath at rest or with exertion.   No wheezes GastrointestinaI: No nausea, vomiting, diarrhea, abdominal pain, fecal incontinence Genitourinary:  No dysuria, urinary retention or frequency.  No nocturia. Musculoskeletal: She has neck pain, low back pain and left knee pain.  She has a history of lumbar surgery at L4-L5.   Integumentary: No rash, pruritus, skin lesions Neurological: as above Psychiatric: She has had depression. Endocrine: No palpitations, diaphoresis, change in appetite, change in weigh or increased thirst  Hematologic/Lymphatic:  No anemia, purpura, petechiae. Allergic/Immunologic: No itchy/runny eyes, nasal congestion, recent allergic reactions, rashes  ALLERGIES: Allergies  Allergen Reactions   Penicillins Rash    Was told by her mother she was allergic to Penicillin Was told by her mother she was allergic to Penicillin     HOME MEDICATIONS:  Current Outpatient Medications:    Acetaminophen 325 MG CAPS, Take 2 po bid or tid, Disp: 120 capsule, Rfl: 11   atorvastatin (LIPITOR) 20 MG tablet, TAKE 1 TABLET(20 MG) BY MOUTH DAILY, Disp: 90 tablet, Rfl: 1   buPROPion (WELLBUTRIN XL) 300 MG 24 hr tablet, Take 300 mg by mouth daily., Disp: , Rfl:    FLUoxetine (PROZAC) 20 MG tablet, Take 20 mg by mouth daily., Disp: , Rfl:    gabapentin (NEURONTIN) 600 MG tablet, 1/2 to 1 pill po tid, Disp: 90 tablet, Rfl: 5   ibuprofen (ADVIL) 200 MG tablet, Take 200 mg by mouth every 6 (six) hours as needed., Disp: , Rfl:    modafinil (PROVIGIL) 200 MG tablet, TAKE 1/2 TO 1 TABLET BY MOUTH EVERY MORNING AND AT NOON, Disp: 180 tablet, Rfl: 1   Omega-3 Fatty Acids (FISH OIL) 1000 MG CAPS, Take 1 capsule by mouth daily., Disp: , Rfl:    oxybutynin (DITROPAN-XL) 5 MG 24 hr tablet, TAKE 1 TABLET(5 MG) BY MOUTH AT BEDTIME, Disp: 90 tablet, Rfl: 1   POTASSIUM PO, Take 1 Dose by mouth daily., Disp: , Rfl:    torsemide (DEMADEX) 10 MG  tablet, Take 10 mg by mouth daily., Disp: , Rfl:    valsartan (DIOVAN) 40 MG tablet, Take 40 mg by mouth daily., Disp: , Rfl:   PAST MEDICAL HISTORY: Past Medical History:  Diagnosis Date   MS (multiple sclerosis) (HCC)    Shingles     PAST SURGICAL HISTORY: Past Surgical History:  Procedure Laterality Date   BACK SURGERY     BACK SURGERY     cataract surgery     dental implants  2014   KNEE SURGERY     REPLACEMENT TOTAL KNEE  2006   ROTATOR CUFF REPAIR      FAMILY HISTORY: History reviewed. No pertinent family history.  SOCIAL HISTORY:  Social History   Socioeconomic History   Marital status: Widowed    Spouse name: Not on file   Number of children: Not on file   Years of education: Not on file   Highest education level: Not on file  Occupational History   Not on file  Tobacco Use   Smoking status: Never   Smokeless tobacco: Never  Substance and Sexual Activity   Alcohol use: Yes    Comment: Daily with dinner - wine   Drug use: Never   Sexual activity: Not on file  Other Topics Concern   Not on file  Social History Narrative   Lives alone   No caffeine use   Right handed    Social Determinants of Health   Financial Resource Strain: Not on file  Food Insecurity: Not on file  Transportation Needs: Not on file  Physical Activity: Not on file  Stress: Not on file  Social Connections: Not on file  Intimate Partner Violence: Not on file     PHYSICAL EXAM  Vitals:   05/08/22 1312  BP: (!) 161/74  Pulse: 62  Weight: 197 lb (89.4 kg)  Height:  (1.676 m)     Body mass index is 31.8 kg/m.   General: The patient is  well-developed and well-nourished and in no acute distres  Skin: Extremities are without rash or edema.  Neurologic Exam  Mental status: The patient is alert and oriented x 3 at the time of the examination. The patient has apparent normal recent and remote memory.  Attention was reduced.Marland Kitchen   Speech is normal.  Cranial nerves:  Extraocular movements are full.    Facial symmetry is present.  Facial strength is normal.  Trapezius and sternocleidomastoid strength is normal. No dysarthria is noted.  No obvious hearing deficits are noted.  Motor:  Muscle bulk is normal.   Tone is normal. Strength is  5 / 5 in all 4 extremities except 4+/5 left ankle/toe extension..   Sensory: Sensory testing is intact to pinprick, soft touch and vibration sensation in arms and trunk but reduced in right leg.     Coordination: Finger-nose-finger is normal and heel-to-shin is performed appropriately for strength  Gait and station: Station is normal.   She can walk without her cane inside the room but has a mild left foot drop.    Gait is arthritic and slightly wide and turn was 3 steps.  She is unable to [facility] tandem walk.  Romberg is negative.  Reflexes: Deep tendon reflexes are symmetric and normal bilaterally.       ASSESSMENT AND PLAN     1. Multiple sclerosis (HCC)   2. Gait disturbance   3. Overactive bladder   4. Other fatigue   5. Major depressive disorder, remission status unspecified, unspecified whether recurrent   6. Chronic bilateral low back pain without sciatica      1.  MRI  showed no evidence of new MS activity.   She will continue off any disease modifying therapy.      2.  Stay active and exercise 3.  Continue gabapentin , modafinil (200 in am and 100 in pm).   4.  Will stop oxybutynin as no benefit.   5.  Due to snoring and EDS and fatigue, we will check HST and consider CPAP based on results. 6.   After her surgery, she has noted pain that is not responding well to Tylenol.  I will start meloxicam 7.5 mg.  Hopefully this can be stopped in another month or so.   7.   Return in 6 months or sooner if there are new or worsening neurologic symptoms.  Sarabeth Benton A. Epimenio Foot, MD, PhD, FAAN Certified in Neurology, Clinical Neurophysiology, Sleep Medicine, Pain Medicine and Neuroimaging Director, Multiple Sclerosis  Center at [facility]  [facility] [address], [address] [place], [place] [address] [phone]

## 2022-09-19 NOTE — Patient Instructions (Addendum)
I am adding meloxicam.  Take one pill a day.  You can take Tylenol up to 3-4 time a day

## 2022-09-21 ENCOUNTER — Telehealth: Payer: Self-pay | Admitting: Neurology

## 2022-09-21 NOTE — Telephone Encounter (Signed)
Caled pt back to get more info. She could not remember why she called. Reminded her to pick up meloxicam 7.5 mg po qd from pharmacy that Dr. Epimenio Foot called in. She will call back if she remembers her questions.

## 2022-09-21 NOTE — Telephone Encounter (Signed)
Pt called yesterday LVM that she wants to talk to nurse, stated she needs to clarify some prescription on the list she got from the office yesterday.

## 2022-09-26 ENCOUNTER — Telehealth: Payer: Self-pay | Admitting: Neurology

## 2022-09-26 NOTE — Telephone Encounter (Signed)
Pt stated she's not feeling well and needs to talk to Dr. Epimenio Foot. Stated she called over and hour ago and no one has returned her called. I informed pt that nurses has 24-48 to return all calls. I informed pt if she is feeling that bad she may need to visit the nearest Urgent Care or ER. Pt informed me that it makes no sense to have a doctor and they want return your call when you are sick, pt then hung up the phone.

## 2022-09-26 NOTE — Telephone Encounter (Signed)
At 1:33 this afternoon pt left a vm asking to be called re: the status of being scheduled for her sleep study.  Pt stated that she is very concerned about how very tired she is in addition to her having dizzy spells, please call.

## 2022-09-26 NOTE — Telephone Encounter (Signed)
Patient informed with Dr.Sater response "I am not sure what caused her dizziness or episode of fatigue.  Is probably not the MS.  She needs to make sure to stay well-hydrated. "

## 2022-09-26 NOTE — Telephone Encounter (Signed)
Dr.Sater I called patient to discuss her symptoms. Pt was seen on 09/15/22  lives at [facility],states today she was eating lunch with her friends and noticed a dizzy spell and extreme fatigue that hit at once. Pt said she was sitting when this happened she went back to her room and to lay down. She is sitting up now and reports she feels a little "woozy". No recent falls, she has not heard from anyone about the sleep study that was ordered at visit on 09/15/22. (Message sent to sleep lab to update pt) she mentioned being out of her modafinil 200 mg tablets I explained a refill was sent on 09/07/22 and she should contact the pharmacy. Pt asked do you have any recommendations about what she can do for the dizziness? Please advise

## 2022-09-27 NOTE — Telephone Encounter (Signed)
Just an FYI  HST- [employer] medicare no auth req  Mailed packet to the patient   I spoke with the patient and schedule her HST that was ordered on 09/19/22 by Dr. Epimenio Foot for 10/10/22 at 1 pm. She informed me that she believes the route to her problem was not taking the modafinil. She stated she picked it up yesterday and will start taking it and stay on top of it.

## 2022-09-28 ENCOUNTER — Telehealth: Payer: Self-pay | Admitting: Neurology

## 2022-09-28 NOTE — Telephone Encounter (Signed)
FYI

## 2022-09-28 NOTE — Telephone Encounter (Signed)
Pt called. Stated she is taking meloxicam (MOBIC) 7.5 MG tablet but she is terrified of what she has read about medication. Pt stated she don't need a call, stated she just wanted to say WOW about what she read about medication.

## 2022-10-10 ENCOUNTER — Ambulatory Visit: Payer: Medicare Other | Admitting: Neurology

## 2022-10-10 DIAGNOSIS — G4719 Other hypersomnia: Secondary | ICD-10-CM

## 2022-10-10 DIAGNOSIS — G4733 Obstructive sleep apnea (adult) (pediatric): Secondary | ICD-10-CM | POA: Diagnosis not present

## 2022-10-10 DIAGNOSIS — R0683 Snoring: Secondary | ICD-10-CM

## 2022-10-12 NOTE — Progress Notes (Signed)
   [facility]  HOME SLEEP STUDY  STUDY DATE: 10/10/2022 PATIENT NAME: Kathy Nelson DOB: 1945/06/08 MRN: [id number]  ORDERING CLINICIAN: Richard A. Epimenio Foot, MD, PhD INTERPRETING CLINICIAN: Richard A. Epimenio Foot, MD, PhD   CLINICAL INFORMATION: 77 year old woman with snoring and EDS  IMPRESSION:  Severe OSA with pAHI 3% = 67.   Nocturnal hypoxemia with 10 minutes of sleep below an SaO2 of 88%   RECOMMENDATION: Recommend Auto-PAP with heated humidifier and initial pressure settings 5 - 15 cm H2O Download and follow u in 30-90 days   INTERPRETING PHYSICIAN:   Richard A. Epimenio Foot, MD, PhD, [facility] Certified in Neurology, Clinical Neurophysiology, Sleep Medicine, Pain Medicine and Neuroimaging  [facility] [address] [place], [place] [address] [phone]

## 2022-10-16 ENCOUNTER — Telehealth: Payer: Self-pay | Admitting: *Deleted

## 2022-10-16 DIAGNOSIS — G4733 Obstructive sleep apnea (adult) (pediatric): Secondary | ICD-10-CM

## 2022-10-16 NOTE — Telephone Encounter (Signed)
Left message for patient to call.

## 2022-10-16 NOTE — Telephone Encounter (Signed)
-----   Message from Asa Lente, MD sent at 10/16/2022 11:51 AM EDT ----- Regarding: home sleepstud HST showed severe OSA  1. Recommend Auto-PAP with heated humidifier and initial pressure settings 5 - 15 cm H2O 2. Download and follow u in 30-90 days

## 2022-10-17 NOTE — Telephone Encounter (Signed)
Patient informed was okay with using [facility], community message sent, order placed.   Phone room please schedule new cpap patient the week after 12/01/22 for 31 day follow up visit.

## 2022-10-18 ENCOUNTER — Other Ambulatory Visit: Payer: Self-pay | Admitting: Neurology

## 2022-10-18 ENCOUNTER — Telehealth: Payer: Self-pay | Admitting: Neurology

## 2022-10-18 DIAGNOSIS — H9202 Otalgia, left ear: Secondary | ICD-10-CM

## 2022-10-18 DIAGNOSIS — R2689 Other abnormalities of gait and mobility: Secondary | ICD-10-CM

## 2022-10-18 NOTE — Telephone Encounter (Signed)
Left detailed message on patient voicemail per DPR access. 

## 2022-10-18 NOTE — Telephone Encounter (Signed)
Phone room: please hold to ensure pt schedule's appt, thank you

## 2022-10-18 NOTE — Telephone Encounter (Signed)
Dr.Sater I spoke with patient about her wanting referral to [facility], [facility]) [address] [facility] [address] [place] [address]  Pt said that last time she made the appointment herself with no referral, this time they need a referral. She reports having 2 falls recent and feels her balance if off, has PT twice per week. She recently diagnosed with OSA ( in process of getting cpap). Pt said also mentioned her left ear hurts, she is able to hear, but the outside of ear is sensitive to touch. She has not seen her PCP about ear pain, she asked if you would refer her since she has been diagnosed with OSA also?  Please advise

## 2022-10-18 NOTE — Telephone Encounter (Signed)
Pt said that in addition to her diagnosis of sleep apnea she has also had a couple of falls.  Pt states she has been to [facility] before ,but she is being told they would need another referral to come back, pt is asking that Dr Epimenio Foot does the referral.

## 2022-10-18 NOTE — Telephone Encounter (Signed)
Called pt. LVM to please call off to schedule appointment. 

## 2022-10-18 NOTE — Telephone Encounter (Signed)
LVM for pt to call back before 5pm today 

## 2022-10-19 ENCOUNTER — Telehealth: Payer: Self-pay | Admitting: Neurology

## 2022-10-19 NOTE — Telephone Encounter (Signed)
Called pt to scheduled initial CPAP appointment. Pt said she haven't received machine yet. Stated she wll call and schedule once she receive CPAP.

## 2022-10-19 NOTE — Telephone Encounter (Signed)
Noted  

## 2022-10-19 NOTE — Telephone Encounter (Signed)
Referral faxed to [facility], N[facility], Thro[facility]: Phone: [phone] Fax: [phone]

## 2022-10-25 ENCOUNTER — Telehealth: Payer: Self-pay

## 2022-10-25 NOTE — Telephone Encounter (Signed)
Pt called stating that she hasn't heard anything back about her referral to [facility], [facility], [facility], told pt that her referral was faxed 10/19/2022, she should call their referral office and check on her referral. Pt verbalized understanding.

## 2022-11-02 NOTE — Telephone Encounter (Signed)
Pt called. Stated [facility] can't see her until 02/28/2023 stated this is not acceptable. Pt asked me to fax a new referral to [facility].  Referral faxed to [facility], [facility]: Phone: [phone]  Fax: [phone]

## 2022-11-13 ENCOUNTER — Telehealth: Payer: Self-pay | Admitting: Neurology

## 2022-11-13 NOTE — Telephone Encounter (Signed)
Left message for patient to call.

## 2022-11-13 NOTE — Telephone Encounter (Signed)
Called to discuss the below with patient. Pt states for about 2 weeks she noticed feeling extremely tired reports this feeling happens daily, the max of episodes like this has been 3 in 1 day. Pt said she has been out running errands and had this feeling in stores and has to stop and take a quick nap in the car. Pt said at times she can sleep about 2 hours and feels better. Reports she sleeps fine at bedtime, scheduled to get cpap machine on 11/21/22, ENT consult scheduled at end of June. Pt mentioned symptoms to physical therapist and she mentioned possible narcolepsy symptoms .

## 2022-11-13 NOTE — Telephone Encounter (Signed)
Pt called wanting to make an appt for more sleep issues. States she started having "sleep attacks" about 2 weeks ago 2x a day where she feels like she has been knocked over the head and must sleep that moment. States it is getting worse and wanted Dr. Epimenio Foot to be aware.

## 2022-11-13 NOTE — Telephone Encounter (Signed)
Dr.Sater this is the patient you asked me to route to you to review.

## 2022-11-14 NOTE — Telephone Encounter (Signed)
Left message for patient to call again. 

## 2022-11-16 NOTE — Telephone Encounter (Signed)
Called and spoke w/ pt. Relayed Dr. Bonnita Hollow message.  She verbalized understanding. She confirmed she is taking modafinil but only 1 in am and 1/2 at noon. She will try increased to 1 po BID as recommended per MD and see how this goes.   She has appt w/ [facility] 11/21/22 to get set up on CPAP a well

## 2022-12-06 ENCOUNTER — Telehealth: Payer: Self-pay | Admitting: Neurology

## 2022-12-06 NOTE — Telephone Encounter (Signed)
Pt said she is getting CPAP machine on friday but keep having panic attacks and can't breath. Pt requesting a call back from nurse

## 2022-12-06 NOTE — Telephone Encounter (Signed)
I called patient and she reports she is able to breath, but she is having panic attacks because she has to wait until Friday to get her cpap. I explained that we can't help her with panic attacks and to reach out to her PCP. Pt verbalized she understood and will reach out to PCP.

## 2022-12-06 NOTE — Telephone Encounter (Signed)
I called and had to LM for her that returned call.  She is to call back.

## 2022-12-12 ENCOUNTER — Other Ambulatory Visit
Admission: RE | Admit: 2022-12-12 | Discharge: 2022-12-12 | Disposition: A | Discharge status: Home / Self Care | Payer: Medicare Other | Source: Ambulatory Visit | Attending: Internal Medicine | Admitting: Internal Medicine

## 2022-12-12 DIAGNOSIS — R0602 Shortness of breath: Secondary | ICD-10-CM | POA: Insufficient documentation

## 2022-12-12 LAB — TROPONIN I (HIGH SENSITIVITY): Troponin I (High Sensitivity): 15 ng/L (ref <18)

## 2022-12-12 LAB — D-DIMER, QUANTITATIVE: D-Dimer, Quant: 1.13 ug/mL-FEU — ABNORMAL HIGH (ref 0.00–0.50)

## 2022-12-13 ENCOUNTER — Other Ambulatory Visit
Admission: RE | Admit: 2022-12-13 | Discharge: 2022-12-13 | Disposition: A | Discharge status: Home / Self Care | Payer: Medicare Other | Source: Ambulatory Visit | Attending: Internal Medicine | Admitting: Internal Medicine

## 2022-12-13 DIAGNOSIS — R0602 Shortness of breath: Secondary | ICD-10-CM | POA: Diagnosis present

## 2022-12-13 LAB — TROPONIN I (HIGH SENSITIVITY): Troponin I (High Sensitivity): 17 ng/L (ref <18)

## 2022-12-14 ENCOUNTER — Ambulatory Visit
Admission: RE | Admit: 2022-12-14 | Discharge: 2022-12-14 | Disposition: A | Discharge status: Home / Self Care | Payer: Medicare Other | Source: Ambulatory Visit | Attending: Internal Medicine | Admitting: Internal Medicine

## 2022-12-14 ENCOUNTER — Other Ambulatory Visit: Payer: Self-pay | Admitting: Internal Medicine

## 2022-12-14 DIAGNOSIS — R0602 Shortness of breath: Secondary | ICD-10-CM | POA: Insufficient documentation

## 2022-12-14 DIAGNOSIS — R7989 Other specified abnormal findings of blood chemistry: Secondary | ICD-10-CM | POA: Diagnosis not present

## 2022-12-14 MED ORDER — IOHEXOL 350 MG/ML SOLN
75.0000 mL | Freq: Once | INTRAVENOUS | Status: AC | PRN
  Administered 2022-12-14: 75 mL via INTRAVENOUS

## 2023-01-04 ENCOUNTER — Other Ambulatory Visit: Payer: Self-pay | Admitting: Neurology

## 2023-01-04 NOTE — Telephone Encounter (Signed)
Per 09/19/22 note "Continue gabapentin , modafinil (200 in am and 100 in pm " Follow up scheduled on 05/15/23

## 2023-01-18 ENCOUNTER — Ambulatory Visit: Payer: Medicare Other | Admitting: Neurology

## 2023-01-18 ENCOUNTER — Encounter: Payer: Self-pay | Admitting: Neurology

## 2023-01-18 VITALS — BP 192/76 | HR 61 | Ht 66.0 in | Wt 183.5 lb

## 2023-01-18 DIAGNOSIS — N3281 Overactive bladder: Secondary | ICD-10-CM | POA: Diagnosis not present

## 2023-01-18 DIAGNOSIS — R5383 Other fatigue: Secondary | ICD-10-CM

## 2023-01-18 DIAGNOSIS — G4733 Obstructive sleep apnea (adult) (pediatric): Secondary | ICD-10-CM | POA: Diagnosis not present

## 2023-01-18 DIAGNOSIS — G35 Multiple sclerosis: Secondary | ICD-10-CM

## 2023-01-18 DIAGNOSIS — R269 Unspecified abnormalities of gait and mobility: Secondary | ICD-10-CM | POA: Diagnosis not present

## 2023-01-18 DIAGNOSIS — N393 Stress incontinence (female) (male): Secondary | ICD-10-CM

## 2023-01-18 MED ORDER — MODAFINIL 200 MG PO TABS: ORAL_TABLET | ORAL | 5 refills | Status: AC

## 2023-01-18 MED ORDER — FLUOXETINE HCL 20 MG PO TABS: 20.0000 mg | ORAL_TABLET | Freq: Every day | ORAL | 3 refills | Status: DC

## 2023-01-18 NOTE — Progress Notes (Signed)
[facility]  PATIENT: Kathy Nelson DOB: [birth year]  REFERRING DOCTOR OR PCP: PCP is [facility] in [place], referred by Dr. Coral Else FNP SOURCE: Patient, notes from [facility] neurology, imaging reports, MRI images personally reviewed.  _________________________________   HISTORICAL  CHIEF COMPLAINT:  Chief Complaint  Patient presents with   Follow-up    Pt in room 10. Here for MS follow up. Pt said this week is having hearing and vision and balance. Pt said vision is out of focus, pt said she can't hear good. Seeing hearing speech therapist states her voice goes out while taking at times. No falls, pt said she balance is off.  Patient blood pressure elevated x 2. Pt said she has white coat syndrome.    HISTORY OF PRESENT ILLNESS:  Kathy Nelson is a 77 y.o. woman with multiple sclerosis.  Update  01/18/2023 She stopped Aubagio around 07/2019.  She has no exacerbations off the medication or new symptom    She had MRI of the brain 06/06/2022 showing no new lesions compared to 2021.  Additionally she had MRI of the cervical and thoracic spine in December 2022 and there were no acute lesions  She had a home sleep stud showing severe OSA with ahi=63.    She got her CPAP November 21, 2022 through [facility] on [address])   She felt she got no training from them and was just handed a machine.   She tried to use it twice but felt congested.   She only has tried one mask.   She does think she slept deeper when she wore it but also had incontinence which is rare.   We discussed trying again and  we will help her request additional training --- April called and they should contact [facility] tomorrow.  She als owas given a contact name and number.    She feels more emotional.  It looks like she stopped her fluoxetine - not sure when probably 2 months ago.  .    Her gait is unsteady and she uses a cane most of the time outside her home.  No  recent falls.    She has fairly symmetric numbness and dysesthesias in her  legs and is on gabapentin 600 mg po tid .  She is not sure how much it helps as she still has discomfort.   Lamotrigine did not help so she stopped.   Her vision is mostly stable     She is noting more urinary urgency and occasional urge incontinence.   She had been on a bladder medications but not sure if helpe   She saw ophthalmologist in April and feels vision is poorly focused.   We checked and is 20/40 OD and 20/30 OS.      She also has congested sensation in her ears but Flonase has not helped.   She saw [facility] has appt to see them back.     Unclear if she was taking it regularly.       She takes Provigil 200 mg in the am and 100 mg mid-day with mild benefit helped fatigue.  She gets depression and she gets upset easily.   She feels she is less sharp than she used to be.    EPWORTH SLEEPINESS SCALE  On a scale of 0 - 3 what is the chance of dozing:  Sitting and Reading:   3 Watching TV:    2 Sitting inactive in a  public place: 1 Passenger in car for one hour: 0 Lying down to rest in the afternoon: 3 Sitting and talking to someone: 0 Sitting quietly after lunch:  2 In a car, stopped in traffic:  0  Total (out of 24):   11/24   mild EDS  She also reports lower back pain.  Pain is worse in the back and into the legs if she stands for a while.  Her legs also feel weaker when she stands a while.Marland Kitchen  MRI in 2013 showed moderate spinal stenosis and anterolisthesis at L4L5    She had  fusion surgery ([facility] L4L5 [facility]) .   Xray in 2020 showed anterolisthesis at L4L5 and L5S1.  Pain is worse if she is on her feet a longer time    MS History: She was diagnosed with MS in 1991 (Dr. Dortha Schwalbe in [place]) after presenting with flank dysesthesias. In retrospect she had optic neuritis in 1987.    She was first told she might have cholecystitis.   Then Imaging studies were consistent with MS.   CSF was also  consistent with MS.   She was placed on Avonex and remained on it x many years.    She did not have any exacerbations.  She moved to [facility] and started to see Dr. Freda Munro.  When he retired, she started to see Dr. Vernie Ammons.    She has had a more SPMS course since 2015 or so with slow worsening of gait and balance.  She started Aubagio in 2018 and stopped Aubagio in 2022  IMAGING The MRI of the brain 11/23/2017 shows typical T2/flair hyperintense foci in the periventricular, juxtacortical deep white matter of the hemispheres consistent with MS.  There is cortical atrophy.    MRI of the cervical spine 11/23/2017 shows no definite MS plaques.  Cervical degenerative changes noted at C3-C4, C4-C5, C5-C6 and C6-C7.  There is bilateral foraminal narrowing at C5-C6 and left greater than right foraminal narrowing at C6-C7   MRI of the thoracic spine 11/23/2017 shows several T2 hyperintense foci.   Unfortunately, the CD PACS does not have the ability to assess spinal levels with scout lines.  In this context, there appears to be a focus at T3-T4, T5-T6 and T8-T9 towards the left.  MRI of the brain 10/23/2019 showed no new lesions  MRI of the brain 03/07/2021 showed Multiple T2/FLAIR hyperintense foci in the hemispheres.  Many of the foci are in the deep white matter and some foci are in the periventricular and juxtacortical white matter.  The pattern is most consistent with a combination of demyelination and chronic microvascular ischemic change.  None of the foci appear to be acute.  They do not enhance.  Compared to the MRI dated 10/22/2019, there were no new lesions..   Moderate generalized cortical atrophy, stable compared to the 2021 MRI.  MRI of the cervical and thoracic spine 05/18/2021 showed a normal cervical spinal cord.  There were T2 hyperintense foci in the thoracic spinal cord anteriorly to the left at T5-T6, centrally adjacent to T6, centrally adjacent to T7.  The foci did not enhance  MRI brain  06/06/2022 shows Multiple T2/FLAIR hyperintense foci in the cerebral hemispheres. Some foci have an appearance most consistent with chronic demyelination as would be seen with multiple sclerosis while others have an appearance more consistent with chronic microvascular ischemic change. 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 The foci appear unchanged compared to the 03/07/2021 MRI.  REVIEW OF SYSTEMS: Constitutional: No fevers, chills, sweats, or change in appetite.  She has fatigue.   Eyes: No visual changes, double vision, eye pain Ear, nose and throat: No hearing loss, ear pain, nasal congestion, sore throat Cardiovascular: No chest pain, palpitations Respiratory:  No shortness of breath at rest or with exertion.   No wheezes GastrointestinaI: No nausea, vomiting, diarrhea, abdominal pain, fecal incontinence Genitourinary:  No dysuria, urinary retention or frequency.  No nocturia. Musculoskeletal: She has neck pain, low back pain and left knee pain.  She has a history of lumbar surgery at L4-L5.   Integumentary: No rash, pruritus, skin lesions Neurological: as above Psychiatric: She has had depression. Endocrine: No palpitations, diaphoresis, change in appetite, change in weigh or increased thirst Hematologic/Lymphatic:  No anemia, purpura, petechiae. Allergic/Immunologic: No itchy/runny eyes, nasal congestion, recent allergic reactions, rashes  ALLERGIES: Allergies  Allergen Reactions   Penicillins Rash    Was told by her mother she was allergic to Penicillin Was told by her mother she was allergic to Penicillin     HOME MEDICATIONS:  Current Outpatient Medications:    aspirin EC 81 MG tablet, Take 1 tablet by mouth 2 (two) times daily., Disp: , Rfl:    atorvastatin (LIPITOR) 20 MG tablet, TAKE 1 TABLET(20 MG) BY MOUTH DAILY, Disp: 90 tablet, Rfl: 1   buPROPion (WELLBUTRIN XL) 300 MG 24 hr tablet, Take 300 mg by mouth daily., Disp: , Rfl:    fluticasone (FLONASE) 50 MCG/ACT  nasal spray, Place 2 sprays into both nostrils daily., Disp: , Rfl:    gabapentin (NEURONTIN) 600 MG tablet, TAKE 1/2 TO 1 TABLET BY MOUTH THREE TIMES DAILY, Disp: 90 tablet, Rfl: 5   potassium chloride (KLOR-CON) 10 MEQ tablet, Take 10 mEq by mouth daily. As needed when taking torsemide, Disp: , Rfl:    torsemide (DEMADEX) 10 MG tablet, Take 10 mg by mouth daily., Disp: , Rfl:    valsartan (DIOVAN) 40 MG tablet, Take 40 mg by mouth daily., Disp: , Rfl:    Acetaminophen 325 MG CAPS, Take 2 po bid or tid (Patient not taking: Reported on 01/18/2023), Disp: 120 capsule, Rfl: 11   FLUoxetine (PROZAC) 20 MG tablet, Take 1 tablet (20 mg total) by mouth daily., Disp: 90 tablet, Rfl: 3   ibuprofen (ADVIL) 200 MG tablet, Take 200 mg by mouth every 6 (six) hours as needed. (Patient not taking: Reported on 01/18/2023), Disp: , Rfl:    meloxicam (MOBIC) 7.5 MG tablet, Take 1 tablet (7.5 mg total) by mouth daily. (Patient not taking: Reported on 01/18/2023), Disp: 30 tablet, Rfl: 2   modafinil (PROVIGIL) 200 MG tablet, TAKE 1/2 TO 1 TABLET BY MOUTH EVERY MORNING AND AT NOON, Disp: 60 tablet, Rfl: 5   Omega-3 Fatty Acids (FISH OIL) 1000 MG CAPS, Take 1 capsule by mouth daily. (Patient not taking: Reported on 01/18/2023), Disp: , Rfl:    POTASSIUM PO, Take 1 Dose by mouth daily. (Patient not taking: Reported on 01/18/2023), Disp: , Rfl:    tolterodine (DETROL) 1 MG tablet, Take 1 mg by mouth daily. For swelling (Patient not taking: Reported on 01/18/2023), Disp: , Rfl:   PAST MEDICAL HISTORY: Past Medical History:  Diagnosis Date   MS (multiple sclerosis) (HCC)    Shingles     PAST SURGICAL HISTORY: Past Surgical History:  Procedure Laterality Date   BACK SURGERY     BACK SURGERY     cataract surgery     dental implants  2014  KNEE SURGERY     REPLACEMENT TOTAL KNEE  2006   ROTATOR CUFF REPAIR      FAMILY HISTORY: No family history on file.  SOCIAL HISTORY:  Social History   Socioeconomic History    Marital status: Widowed    Spouse name: Not on file   Number of children: Not on file   Years of education: Not on file   Highest education level: Not on file  Occupational History   Not on file  Tobacco Use   Smoking status: Never   Smokeless tobacco: Never  Substance and Sexual Activity   Alcohol use: Yes    Comment: Daily with dinner - wine   Drug use: Never   Sexual activity: Not on file  Other Topics Concern   Not on file  Social History Narrative   Lives alone   No caffeine use   Right handed    Social Determinants of Health   Financial Resource Strain: Not on file  Food Insecurity: Not on file  Transportation Needs: Not on file  Physical Activity: Not on file  Stress: Not on file  Social Connections: Not on file  Intimate Partner Violence: Not on file     PHYSICAL EXAM  Vitals:   01/18/23 1527 01/18/23 1538  BP: (!) 172/83 (!) 192/76  Pulse: 63 61  Weight: 183 lb 8 oz (83.2 kg)   Height: 5\' 6"  (1.676 m)      Body mass index is 29.62 kg/m.   General: The patient is well-developed and well-nourished and in no acute distres  Skin: Extremities are without rash or edema.  Neurologic Exam  Mental status: The patient is alert and oriented x 3 at the time of the examination. The patient has apparent normal recent and remote memory.  Attention was reduced.Marland Kitchen   Speech is normal.  Cranial nerves: Extraocular movements are full.    Facial symmetry is present.  Facial strength is normal.  Trapezius and sternocleidomastoid strength is normal. No dysarthria is noted.  No obvious hearing deficits are noted.  Motor:  Muscle bulk is normal.   Tone is normal. Strength is  5 / 5 in all 4 extremities except 4+/5 left ankle/toe extension..   Sensory: Sensory testing is intact to pinprick, soft touch and vibration sensation in arms and trunk but reduced in right leg.     Coordination: Finger-nose-finger is normal and heel-to-shin is performed appropriately for  strength  Gait and station: Station is normal.   She can walk without her cane inside the room but has a mild left foot drop.    Gait is arthritic and slightly wide and turn was 3 steps.  She is unable to do a tandem walk.  Romberg is negative.  Reflexes: Deep tendon reflexes are symmetric and normal bilaterally.       ASSESSMENT AND PLAN     1. Multiple sclerosis (HCC)   2. OSA (obstructive sleep apnea)   3. Gait disturbance   4. Overactive bladder   5. Other fatigue   6. Stress incontinence, female     1.  No new MS activity.   She will continue off any disease modifying therapy.      2.  Stay active and exercise 3.  Continue gabapentin , modafinil (200 in am and 100 in pm).   4.  Renew fluoxetine (stopped a few months ago) 5.  She feels she needs to get retrained for CPAP.    6.   Vision is mildly  reduced but could be refraction   Advised to see ophthalmology 7.    return in 6 months or sooner if there are new or worsening neurologic symptoms.   This visit is part of a comprehensive longitudinal care medical relationship regarding the patients primary diagnosis of MS, OSA and related concerns.  Cadynce Garrette A. Epimenio Foot, MD, PhD, FAAN Certified in Neurology, Clinical Neurophysiology, Sleep Medicine, Pain Medicine and Neuroimaging Director, [facility] at [facility]  [facility] [address] [place], [place] [address] [phone]

## 2023-02-08 ENCOUNTER — Other Ambulatory Visit (HOSPITAL_COMMUNITY): Payer: Self-pay

## 2023-02-08 ENCOUNTER — Telehealth: Payer: Self-pay

## 2023-02-08 MED ORDER — FLUOXETINE HCL 20 MG PO CAPS: 20.0000 mg | ORAL_CAPSULE | Freq: Every day | ORAL | 3 refills | Status: AC

## 2023-02-08 NOTE — Telephone Encounter (Signed)
Called pt and she has not had issue with capsules, and is ok to take this vs tablets. I relayed to PA team and ordered capsules.

## 2023-02-08 NOTE — Telephone Encounter (Signed)
Pharmacy Patient Advocate Encounter   Received notification from CoverMyMeds that prior authorization for FLUoxetine HCl 20MG  tablets  is required/requested.   Insurance verification completed.   The patient is insured through [facility] .   Per test claim:  Fluoxetine Capsules is preferred by the insurance.  If suggested medication is appropriate, Please send in a new RX and discontinue this one. If not, please advise as to why it's not appropriate so that we may request a Prior Authorization.

## 2023-04-09 ENCOUNTER — Telehealth: Payer: Self-pay | Admitting: Neurology

## 2023-04-09 ENCOUNTER — Encounter: Payer: Self-pay | Admitting: Neurology

## 2023-04-09 NOTE — Telephone Encounter (Signed)
LVM and sent letter in mail informing pt of need to reschedule 08/01/23 appt - MD out

## 2023-05-15 ENCOUNTER — Ambulatory Visit: Payer: Medicare Other | Admitting: Neurology

## 2023-07-09 ENCOUNTER — Telehealth: Payer: Self-pay | Admitting: Neurology

## 2023-07-09 NOTE — Telephone Encounter (Signed)
Pt said seeing a neurologist closer to home so not so far to drive. Thanks Dr. Epimenio Foot for all he's done. Transferring care to another neurologist.  Need a report from Billing on date of services and how much cost, need for tax purposes. Transferred call to Billing.  Pt also requesting Medical Records for new neurologist. Informed patient to get new neurologist to fax over Release of information form signed by you to [facility] Medical Records.

## 2023-08-01 ENCOUNTER — Ambulatory Visit: Payer: Medicare Other | Admitting: Neurology

## 2023-08-15 ENCOUNTER — Ambulatory Visit: Attending: Otolaryngology

## 2023-08-15 DIAGNOSIS — G4710 Hypersomnia, unspecified: Secondary | ICD-10-CM | POA: Diagnosis present

## 2023-08-15 DIAGNOSIS — I10 Essential (primary) hypertension: Secondary | ICD-10-CM | POA: Insufficient documentation

## 2023-08-15 DIAGNOSIS — G4733 Obstructive sleep apnea (adult) (pediatric): Secondary | ICD-10-CM | POA: Diagnosis not present

## 2023-09-06 ENCOUNTER — Other Ambulatory Visit: Payer: Self-pay | Admitting: Neurology

## 2023-09-06 NOTE — Telephone Encounter (Signed)
 Last seen on 01/18/23 No 6 month follow up scheduled  Per telephone encounter 07/09/23. Pt has moved and has new neurologist.   I called [facility] and left a detailed message on patient voicemail that pt has moved and has new neurologist and Rx should go to that provider.

## 2024-07-07 ENCOUNTER — Ambulatory Visit: Payer: Self-pay | Admitting: Psychiatry

## 2024-07-10 ENCOUNTER — Ambulatory Visit: Admitting: Psychiatry

## 2024-09-01 ENCOUNTER — Ambulatory Visit: Admitting: Psychiatry
